# Patient Record
Sex: Male | Born: 1990 | Race: Black or African American | Hispanic: No | Marital: Single | Smoking: Never smoker
Health system: Southern US, Community
[De-identification: ages and names within clinical notes are randomized; demographics above are authoritative.]

## PROBLEM LIST (undated history)

## (undated) DIAGNOSIS — M419 Scoliosis, unspecified: Secondary | ICD-10-CM

## (undated) DIAGNOSIS — R480 Dyslexia and alexia: Secondary | ICD-10-CM

## (undated) DIAGNOSIS — R413 Other amnesia: Secondary | ICD-10-CM

## (undated) DIAGNOSIS — M549 Dorsalgia, unspecified: Secondary | ICD-10-CM

## (undated) DIAGNOSIS — G43909 Migraine, unspecified, not intractable, without status migrainosus: Secondary | ICD-10-CM

## (undated) DIAGNOSIS — Q8500 Neurofibromatosis, unspecified: Secondary | ICD-10-CM

## (undated) DIAGNOSIS — G8929 Other chronic pain: Secondary | ICD-10-CM

## (undated) HISTORY — DX: Scoliosis, unspecified: M41.9

## (undated) HISTORY — PX: HERNIA REPAIR: SHX51

## (undated) HISTORY — DX: Neurofibromatosis, unspecified: Q85.00

## (undated) HISTORY — DX: Other amnesia: R41.3

## (undated) HISTORY — DX: Dyslexia and alexia: R48.0

## (undated) HISTORY — DX: Other chronic pain: G89.29

## (undated) HISTORY — DX: Migraine, unspecified, not intractable, without status migrainosus: G43.909

## (undated) HISTORY — DX: Dorsalgia, unspecified: M54.9

---

## 2009-10-19 ENCOUNTER — Emergency Department (HOSPITAL_COMMUNITY): Admission: EM | Admit: 2009-10-19 | Discharge: 2009-10-19 | Payer: Self-pay | Admitting: Family Medicine

## 2010-09-21 LAB — STREP A DNA PROBE: Group A Strep Probe: NEGATIVE

## 2012-07-11 DIAGNOSIS — M419 Scoliosis, unspecified: Secondary | ICD-10-CM | POA: Insufficient documentation

## 2014-06-16 DIAGNOSIS — R0602 Shortness of breath: Secondary | ICD-10-CM | POA: Insufficient documentation

## 2014-06-17 ENCOUNTER — Other Ambulatory Visit (HOSPITAL_COMMUNITY): Payer: Self-pay | Admitting: Internal Medicine

## 2014-06-17 ENCOUNTER — Ambulatory Visit (HOSPITAL_COMMUNITY): Payer: Commercial Managed Care - PPO

## 2014-06-17 DIAGNOSIS — R0602 Shortness of breath: Secondary | ICD-10-CM

## 2014-06-17 DIAGNOSIS — R0781 Pleurodynia: Secondary | ICD-10-CM

## 2014-06-18 ENCOUNTER — Other Ambulatory Visit (HOSPITAL_COMMUNITY): Payer: Self-pay | Admitting: Internal Medicine

## 2014-06-18 ENCOUNTER — Ambulatory Visit (HOSPITAL_COMMUNITY)
Admission: RE | Admit: 2014-06-18 | Discharge: 2014-06-18 | Disposition: A | Discharge status: Home / Self Care | Payer: 59 | Source: Ambulatory Visit | Attending: Internal Medicine | Admitting: Internal Medicine

## 2014-06-18 DIAGNOSIS — J90 Pleural effusion, not elsewhere classified: Secondary | ICD-10-CM | POA: Diagnosis not present

## 2014-06-18 DIAGNOSIS — Q8500 Neurofibromatosis, unspecified: Secondary | ICD-10-CM | POA: Insufficient documentation

## 2014-06-18 DIAGNOSIS — R0781 Pleurodynia: Secondary | ICD-10-CM | POA: Insufficient documentation

## 2014-06-18 DIAGNOSIS — R0600 Dyspnea, unspecified: Secondary | ICD-10-CM | POA: Diagnosis not present

## 2014-06-18 DIAGNOSIS — R0602 Shortness of breath: Secondary | ICD-10-CM

## 2014-06-18 DIAGNOSIS — R079 Chest pain, unspecified: Secondary | ICD-10-CM | POA: Insufficient documentation

## 2014-07-14 ENCOUNTER — Ambulatory Visit (INDEPENDENT_AMBULATORY_CARE_PROVIDER_SITE_OTHER): Payer: 59 | Admitting: Pulmonary Disease

## 2014-07-14 ENCOUNTER — Ambulatory Visit (INDEPENDENT_AMBULATORY_CARE_PROVIDER_SITE_OTHER)
Admission: RE | Admit: 2014-07-14 | Discharge: 2014-07-14 | Disposition: A | Discharge status: Home / Self Care | Payer: 59 | Source: Ambulatory Visit | Attending: Pulmonary Disease | Admitting: Pulmonary Disease

## 2014-07-14 ENCOUNTER — Encounter: Payer: Self-pay | Admitting: Pulmonary Disease

## 2014-07-14 VITALS — BP 112/62 | HR 94 | Ht 68.0 in | Wt 188.0 lb

## 2014-07-14 DIAGNOSIS — R059 Cough, unspecified: Secondary | ICD-10-CM | POA: Insufficient documentation

## 2014-07-14 DIAGNOSIS — R05 Cough: Secondary | ICD-10-CM

## 2014-07-14 DIAGNOSIS — D3610 Benign neoplasm of peripheral nerves and autonomic nervous system, unspecified: Secondary | ICD-10-CM | POA: Insufficient documentation

## 2014-07-14 DIAGNOSIS — J90 Pleural effusion, not elsewhere classified: Secondary | ICD-10-CM

## 2014-07-14 MED ORDER — INDOMETHACIN 25 MG PO CAPS: 25.0000 mg | ORAL_CAPSULE | Freq: Two times a day (BID) | ORAL | Status: DC

## 2014-07-14 MED ORDER — DOXYCYCLINE HYCLATE 100 MG PO TABS: 100.0000 mg | ORAL_TABLET | Freq: Two times a day (BID) | ORAL | Status: DC

## 2014-07-14 MED ORDER — TRAMADOL HCL 50 MG PO TABS: 50.0000 mg | ORAL_TABLET | Freq: Four times a day (QID) | ORAL | Status: DC | PRN

## 2014-07-14 NOTE — Assessment & Plan Note (Addendum)
I have reviewed the images of the CT chest from December 2015 with Gary Dorsey today in clinic. He has chest pain of about 3 weeks duration which is consistent with pleurisy. Objectively his vital signs are normal and his lung exam is normal. On CT chest there is a very small pleural effusion with a trace amount of atelectasis in the right base.  I explained to him that a right-sided pleural effusion which develops acutely like this and a young adult who is a nonsmoker most likely represents an acute viral process such as viral pleurisy. I also explained that there is a differential diagnosis which includes connective tissue diseases versus less likely malignancy. Considering how small the effusion is on the CT chest to be very difficult to sample this fluid and the only way to get a diagnosis would be to perform a pleural biopsy which I do not think is warranted at this time considering the fact that this is most likely viral pleuris He had a slight amount of atelectasis versus less likely airspace disease associated with the effusion in the right base, while I do not feel strongly this represents pneumonia, considering the duration of symptoms it is worth treating him for bacterial pneumonia at this time.  Plan: -Doxycycline 100 mg by mouth twice a day 5 days with probiotic -Check chest x-ray today to ensure that fusion has not grown -Treat with Indocin around the clock for one week, advised not to use ibuprofen during this time -Return to clinic in 2-4 weeks if not better, sooner if worse -If pain still present in one month then we will need to figure out how to get a tissue or fluid sample for a diagnosis

## 2014-07-14 NOTE — Patient Instructions (Signed)
We will call you with the results of the chest x-ray  For the first three days: You need to try to suppress your cough to allow your larynx (voice box) to heal.  For three days don't talk, laugh, sing, or clear your throat. Do everything you can to suppress the cough during this time. Use hard candies (sugarless Jolly Ranchers) or non-mint or non-menthol containing cough drops during this time to soothe your throat.  Use a cough suppressant (tramadol) around the clock during this time.  After three days, gradually increase the use of your voice and back off on the cough suppressants and change to delsym.  After three days as noted above, use the indomethacin 25mg  twice a day for 7 days  If you are not better in 3-4 weeks come back to see me, if worse see me sooner, if better then cancel the appointment

## 2014-07-14 NOTE — Assessment & Plan Note (Signed)
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 There is no nodular contour to the pleura. There is only a slight amount of pleural thickening with this very tiny pleural effusion. If the effusion and pain have not improved, as outlined above, we will repeat a CT chest.

## 2014-07-14 NOTE — Assessment & Plan Note (Signed)
He has a slight hacking cough related to this illness. As noted above we will treat for community acquired pneumonia. Also, I'll repeat a chest x-ray. I have also advised that he use tramadol for 3 times with voice rest and make an attempt to actively suppress the cough.

## 2014-07-14 NOTE — Progress Notes (Signed)
Subjective:    Patient ID: Gary Dorsey, male    DOB: 08/31/1990, 24 y.o.   MRN: [id number]  HPI Chief Complaint  Patient presents with  . Advice Only    referred for pleural effusion by Dr Ardeth Perfect.  Pt having R sided sharp pain Xfew weeks.     This is a very pleasant 24 year old male who is a nonsmoker who comes to my clinic today for evaluation of a right-sided pleural effusion. He says that 3-4 weeks ago he developed the insidious onset of right sided chest pain. He says that the pain has been sharp in nature and somewhat constant. It is worsened by taking a deep breath and lying on that side. He says that it has been associated with a slight cough. He denies a fever or chills. He denies shortness of breath. He has not been producing sputum. He denies having sick contacts. He has a past medical history significant for neurofibromatosis and he believes that he has developed 2 new nodules in the skin over his liver in the last 3-4 months.  He has taken over-the-counter cough medication which he says has not helped much. He says he has also been using ibuprofen which helps with the pain. He denies recent fever. Overall, he says that the pain has improved somewhat in the last few weeks.  Past Medical History  Diagnosis Date  . Hx of neurofibromatosis      Family History  Problem Relation Age of Onset  . Family history unknown: Yes     History   Social History  . Marital Status: Single    Spouse Name: N/A    Number of Children: N/A  . Years of Education: N/A   Occupational History  . Not on file.   Social History Main Topics  . Smoking status: Never Smoker   . Smokeless tobacco: Never Used  . Alcohol Use: 0.0 oz/week    0 Not specified per week     Comment: very rarely  . Drug Use: No  . Sexual Activity: Not on file   Other Topics Concern  . Not on file   Social History Narrative  . No narrative on file     Allergies  Allergen Reactions  . Contrast Media  [Iodinated Diagnostic Agents]     Nausea,vomiting     No outpatient prescriptions prior to visit.   No facility-administered medications prior to visit.       Review of Systems  Constitutional: Negative for fever and unexpected weight change.  HENT: Negative for congestion, dental problem, ear pain, nosebleeds, postnasal drip, rhinorrhea, sinus pressure, sneezing, sore throat and trouble swallowing.   Eyes: Negative for redness and itching.  Respiratory: Positive for cough and chest tightness. Negative for shortness of breath and wheezing.   Cardiovascular: Negative for palpitations and leg swelling.  Gastrointestinal: Negative for nausea and vomiting.  Genitourinary: Negative for dysuria.  Musculoskeletal: Negative for joint swelling.  Skin: Negative for rash.  Neurological: Negative for headaches.  Hematological: Does not bruise/bleed easily.  Psychiatric/Behavioral: Negative for dysphoric mood. The patient is not nervous/anxious.        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 He has chest pain of about 3 weeks duration which is consistent with pleurisy. Objectively his vital signs are normal and his lung exam is normal. On CT chest there is a very small pleural effusion with a trace amount of  atelectasis in the right base.  I explained to him that a right-sided pleural effusion which develops acutely like this and a young adult who is a nonsmoker most likely represents an acute viral process such as viral pleurisy. I also explained that there is a differential diagnosis which includes connective tissue diseases versus less likely malignancy. Considering how small the effusion is on the CT chest to be very difficult to sample this fluid and the only way to get a diagnosis would be to perform a pleural biopsy which I do not think is warranted at this time considering the fact that this is most likely viral pleuris He had a slight amount of atelectasis versus less likely airspace disease associated with the effusion in the right base, while I do not feel strongly this represents pneumonia, considering the duration of symptoms it is worth treating him for bacterial pneumonia at this time.  Plan: -Doxycycline 100 mg by mouth twice a day 5 days with probiotic -Check chest x-ray today to ensure that fusion has not grown -Treat with Indocin around the clock for one week, advised not to use ibuprofen during this time -Return to clinic in 2-4 weeks if not better, sooner if worse -If pain still present in one month then we will need to figure out how to get a tissue or fluid sample for a diagnosis  Cough He has a slight hacking cough related to this illness. As noted above we will treat for community acquired pneumonia. Also, I'll repeat a chest x-ray. I have also advised that he use tramadol for 3 times with voice rest and make an attempt to actively suppress the cough.  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 There is no nodular contour to the pleura. There is only a slight amount of pleural thickening with this very tiny pleural effusion. If the effusion and pain have not improved, as  outlined above, we will repeat a CT chest.    Updated Medication List Outpatient Encounter Prescriptions as of 07/14/2014  Medication Sig  . dextromethorphan (DELSYM) 30 MG/5ML liquid Take 15 mg by mouth daily as needed for cough.  . [DISCONTINUED] ibuprofen (ADVIL,MOTRIN) 400 MG tablet Take 400 mg by mouth every 6 (six) hours as needed.  . doxycycline (VIBRA-TABS) 100 MG tablet Take 1 tablet (100 mg total) by mouth 2 (two) times daily.  . indomethacin (INDOCIN) 25 MG capsule Take 1 capsule (25 mg total) by mouth 2 (two) times daily with a meal.  . traMADol (ULTRAM) 50 MG tablet Take 1 tablet (50 mg total) by mouth every 6 (six) hours as needed (cough).

## 2014-07-16 NOTE — Progress Notes (Signed)
Quick Note:  lmtcb X1 ______ 

## 2014-07-17 ENCOUNTER — Telehealth: Payer: Self-pay | Admitting: Pulmonary Disease

## 2014-07-17 NOTE — Telephone Encounter (Signed)
A,      Please let him know that the fluid has not increased in size.    Thanks    B   --  I spoke with patient about results and he verbalized understanding and had no questions

## 2014-08-15 ENCOUNTER — Encounter: Payer: Self-pay | Admitting: Pulmonary Disease

## 2014-08-15 ENCOUNTER — Ambulatory Visit (HOSPITAL_COMMUNITY)
Admission: RE | Admit: 2014-08-15 | Discharge: 2014-08-15 | Disposition: A | Discharge status: Home / Self Care | Payer: 59 | Source: Ambulatory Visit | Attending: Pulmonary Disease | Admitting: Pulmonary Disease

## 2014-08-15 ENCOUNTER — Other Ambulatory Visit (INDEPENDENT_AMBULATORY_CARE_PROVIDER_SITE_OTHER): Payer: 59

## 2014-08-15 ENCOUNTER — Ambulatory Visit (INDEPENDENT_AMBULATORY_CARE_PROVIDER_SITE_OTHER): Payer: 59 | Admitting: Pulmonary Disease

## 2014-08-15 VITALS — BP 116/64 | HR 82 | Ht 68.0 in | Wt 185.0 lb

## 2014-08-15 DIAGNOSIS — R0789 Other chest pain: Secondary | ICD-10-CM

## 2014-08-15 DIAGNOSIS — R091 Pleurisy: Secondary | ICD-10-CM | POA: Insufficient documentation

## 2014-08-15 DIAGNOSIS — J90 Pleural effusion, not elsewhere classified: Secondary | ICD-10-CM

## 2014-08-15 DIAGNOSIS — R05 Cough: Secondary | ICD-10-CM

## 2014-08-15 DIAGNOSIS — R079 Chest pain, unspecified: Secondary | ICD-10-CM | POA: Insufficient documentation

## 2014-08-15 DIAGNOSIS — R059 Cough, unspecified: Secondary | ICD-10-CM

## 2014-08-15 LAB — BASIC METABOLIC PANEL
BUN: 19 mg/dL (ref 6–23)
CALCIUM: 9.4 mg/dL (ref 8.4–10.5)
CHLORIDE: 104 meq/L (ref 96–112)
CO2: 28 mEq/L (ref 19–32)
Creatinine, Ser: 1.18 mg/dL (ref 0.40–1.50)
GFR: 96.13 mL/min (ref >60.00)
Glucose, Bld: 89 mg/dL (ref 70–99)
POTASSIUM: 4.1 meq/L (ref 3.5–5.1)
SODIUM: 137 meq/L (ref 135–145)

## 2014-08-15 MED ORDER — BENZONATATE 200 MG PO CAPS: 200.0000 mg | ORAL_CAPSULE | Freq: Three times a day (TID) | ORAL | Status: DC | PRN

## 2014-08-15 MED ORDER — HYDROCOD POLST-CHLORPHEN POLST 10-8 MG/5ML PO LQCR: 5.0000 mL | Freq: Two times a day (BID) | ORAL | Status: DC | PRN

## 2014-08-15 MED ORDER — IOHEXOL 350 MG/ML SOLN
100.0000 mL | Freq: Once | INTRAVENOUS | Status: AC | PRN
  Administered 2014-08-15: 100 mL via INTRAVENOUS

## 2014-08-15 MED ORDER — PREDNISONE 10 MG PO TABS: ORAL_TABLET | ORAL | Status: DC

## 2014-08-15 NOTE — Progress Notes (Signed)
Subjective:    Patient ID: Gary Dorsey, male    DOB: October 05, 1990, 24 y.o.   MRN: [id number]  Synopsis: Gary Dorsey was referred in 2016 for evaluation of chest pain with nonspecific pleural thickening seen on a CT chest from December 2015.  HPI Chief Complaint  Patient presents with  . Follow-up    Pt's pain level is unchanged, pain is more in the back instead of the side, sometimes prod cough with yellow mucus.     Gary Dorsey returns to clinic today for further evaluation of his cough and chest pain. He says that the pain has migrated somewhat and is now more vague back pain rather than frank right-sided chest pain. However, he continues to cough frequently. He has no shortness of breath. Cough is quite bothersome as is the chest pain. The pain is still worse with coughing and with taking a deep breath. It is sharp in nature. He is not coughing up mucus at this point that he continues to have a dry cough. He took the doxycycline he said this helped him produce a lot of mucus. Also, the indomethacin did help the chest pain briefly while he was taking it.   Past Medical History  Diagnosis Date  . Hx of neurofibromatosis       Review of Systems  Constitutional: Negative for fever, chills and fatigue.  HENT: Negative for postnasal drip, rhinorrhea and sinus pressure.   Respiratory: Positive for cough and shortness of breath. Negative for wheezing.   Cardiovascular: Positive for chest pain. Negative for palpitations and leg swelling.       Objective:   Physical Exam Filed Vitals:   08/15/14 1626  BP: 116/64  Pulse: 82  Height: 5\' 8"  (1.727 m)  Weight: 185 lb (83.915 kg)  SpO2: 97%  RA   Gen: well appearing, no acute distress HEENT: NCAT, EOMi, OP clear,  PULM: CTA B CV: RRR, no mgr, no JVD AB: BS+, soft, nontender Ext: warm, no edema, no clubbing, no cyanosis Derm: neurofibroma right lower back Neuro: A&Ox4, MAEW   January 2016 chest x-ray and December 2015 chest CT images  reviewed again today in clinic> there is a persistent right-sided pleural effusion versus pleural thickening noted on both      Assessment & Plan:   Pleural effusion I am concerned about the ongoing chest pain and pleuritic pain. Specifically, what bothers me the most is the fact that the pleural thickening and pain seem to have persisted. I explained to him today that again the most likely scenario is that this is a viral pleurisy, but the differential diagnosis includes pulmonary embolism, less likely an inflammatory process from something like lupus, or even less likely a malignancy.  To move forward think we need to evaluate for pulmonary embolism. Assuming that is negative then I want him to treat this with steroids to see if he can make the pain go away. Before he starts steroids we will work him up for connective tissue disease.  Plan: -CT angiogram chest -Check serum ANA -Prednisone 3 weeks total starting at 30 mg, tapering down -If persistent pain and pleural thickening after 3 weeks of prednisone then consider a pleural biopsy   Cough This continues to be a problem for him.   Plan:  Tussionex to use at night Tessalon to use during the daytime     Updated Medication List Outpatient Encounter Prescriptions as of 08/15/2014  Medication Sig  . dextromethorphan (DELSYM) 30 MG/5ML liquid Take 15 mg  by mouth daily as needed for cough.  . traMADol (ULTRAM) 50 MG tablet Take 1 tablet (50 mg total) by mouth every 6 (six) hours as needed (cough).  . benzonatate (TESSALON) 200 MG capsule Take 1 capsule (200 mg total) by mouth 3 (three) times daily as needed for cough.  . predniSONE (DELTASONE) 10 MG tablet Take 30mg  daily for a week, then 20mg  daily for a week, then 10mg  daily for a week  . [DISCONTINUED] chlorpheniramine-HYDROcodone (TUSSIONEX PENNKINETIC ER) 10-8 MG/5ML LQCR Take 5 mLs by mouth every 12 (twelve) hours as needed for cough.  . [DISCONTINUED] doxycycline (VIBRA-TABS)  100 MG tablet Take 1 tablet (100 mg total) by mouth 2 (two) times daily.  . [DISCONTINUED] indomethacin (INDOCIN) 25 MG capsule Take 1 capsule (25 mg total) by mouth 2 (two) times daily with a meal.

## 2014-08-15 NOTE — Assessment & Plan Note (Signed)
This continues to be a problem for him.   Plan:  Tussionex to use at night Tessalon to use during the daytime

## 2014-08-15 NOTE — Assessment & Plan Note (Signed)
I am concerned about the ongoing chest pain and pleuritic pain. Specifically, what bothers me the most is the fact that the pleural thickening and pain seem to have persisted. I explained to him today that again the most likely scenario is that this is a viral pleurisy, but the differential diagnosis includes pulmonary embolism, less likely an inflammatory process from something like lupus, or even less likely a malignancy.  To move forward think we need to evaluate for pulmonary embolism. Assuming that is negative then I want him to treat this with steroids to see if he can make the pain go away. Before he starts steroids we will work him up for connective tissue disease.  Plan: -CT angiogram chest -Check serum ANA -Prednisone 3 weeks total starting at 30 mg, tapering down -If persistent pain and pleural thickening after 3 weeks of prednisone then consider a pleural biopsy

## 2014-08-15 NOTE — Patient Instructions (Addendum)
Take the Tussionex twice a day as needed for cough, don't take it and drive Use tessalon perles on work days for the cough instead of the Tussionex (tessalon is non-sedating) Take prednisone for three weeks as I prescribed We will call you with the results of the CT scan We will see you back in 3-4 weeks or sooner if needed

## 2014-08-19 NOTE — Progress Notes (Signed)
Quick Note:  lmtcb X1 to make pt aware. ______

## 2014-08-20 NOTE — Progress Notes (Signed)
Quick Note:  Pt aware of results and recs ______ 

## 2014-09-08 ENCOUNTER — Encounter: Payer: Self-pay | Admitting: Adult Health

## 2014-09-08 ENCOUNTER — Other Ambulatory Visit (INDEPENDENT_AMBULATORY_CARE_PROVIDER_SITE_OTHER): Payer: 59

## 2014-09-08 ENCOUNTER — Ambulatory Visit (INDEPENDENT_AMBULATORY_CARE_PROVIDER_SITE_OTHER): Payer: 59 | Admitting: Adult Health

## 2014-09-08 VITALS — BP 100/70 | HR 74 | Temp 98.6°F | Ht 68.0 in | Wt 188.0 lb

## 2014-09-08 DIAGNOSIS — R091 Pleurisy: Secondary | ICD-10-CM

## 2014-09-08 DIAGNOSIS — R05 Cough: Secondary | ICD-10-CM

## 2014-09-08 DIAGNOSIS — R0789 Other chest pain: Secondary | ICD-10-CM

## 2014-09-08 DIAGNOSIS — R059 Cough, unspecified: Secondary | ICD-10-CM

## 2014-09-08 NOTE — Assessment & Plan Note (Signed)
Cough ? Etiology  Check PFT on return  Cough control  Follow up labs

## 2014-09-08 NOTE — Assessment & Plan Note (Signed)
Improving right-sided pleuritic pain, questionable etiology For now taper off steroids  Follow up labs- ANA and ESR done today, (missed in lab last ov )  follow up 4 weeks

## 2014-09-08 NOTE — Patient Instructions (Signed)
Labs today  Finish Prednisone today as planned  Delsym  2 tsp Twice daily  As needed  Cough .  Follow up Dr. Lake Bells in 4 weeks with PFT and As needed

## 2014-09-08 NOTE — Progress Notes (Signed)
   Subjective:    Patient ID: Gary Dorsey, male    DOB: December 24, 1990, 24 y.o.   MRN: [id number]  HPI  24 year old male with neurofibromatosis  Synopsis: Gary Dorsey was referred in 2016 for evaluation of chest pain with nonspecific pleural thickening seen on a CT chest from December 2015.  09/08/2014 Follow up  Returns for a three-week follow-up. Patient has been having right-sided pleuritic chest wall pain over the last 3 months. He was seen for initial pulmonary consultation in January. CT chest from December 2015 showed a nonspecific pleural thickening. Patient was sent for a CT chest angiogram that showed no pulmonary embolism, mild right basilar atelectasis and tiny right pleural effusion versus pleural thickening. Patient was started on a prednisone taper over the last 3 weeks. Last dose today. Patient says his pain is quite a bit better , however, he continues to have an intermittent dry cough and some tenderness with trying to lie on the right side. He denies any hemoptysis, orthopnea, PND, leg swelling or edema. No joint swelling or rash.    Review of Systems Constitutional:   No  weight loss, night sweats,  Fevers, chills, fatigue, or  lassitude.  HEENT:   No headaches,  Difficulty swallowing,  Tooth/dental problems, or  Sore throat,                No sneezing, itching, ear ache, nasal congestion, post nasal drip,   CV:  No chest pain,  Orthopnea, PND, swelling in lower extremities, anasarca, dizziness, palpitations, syncope.   GI  No heartburn, indigestion, abdominal pain, nausea, vomiting, diarrhea, change in bowel habits, loss of appetite, bloody stools.   Resp: No chest wall deformity  Skin: no rash or lesions.  GU: no dysuria, change in color of urine, no urgency or frequency.  No flank pain, no hematuria   MS:  No joint pain or swelling.  No decreased range of motion.  No back pain.  Psych:  No change in mood or affect. No depression or anxiety.  No memory  loss.         Objective:   Physical Exam  GEN: A/Ox3; pleasant , NAD, well nourished   HEENT:  [place]/AT,  EACs-clear, TMs-wnl, NOSE-clear, THROAT-clear, no lesions, no postnasal drip or exudate noted.   NECK:  Supple w/ fair ROM; no JVD; normal carotid impulses w/o bruits; no thyromegaly or nodules palpated; no lymphadenopathy.  RESP  Clear  P & A; w/o, wheezes/ rales/ or rhonchi.no accessory muscle use, no dullness to percussion  CARD:  RRR, no m/r/g  , no peripheral edema, pulses intact, no cyanosis or clubbing.  GI:   Soft & nt; nml bowel sounds; no organomegaly or masses detected.  Musco: Warm bil, no deformities or joint swelling noted.   Neuro: alert, no focal deficits noted.    Skin: Warm, no lesions or rashes        Assessment & Plan:

## 2014-09-09 LAB — ANA: Anti Nuclear Antibody(ANA): NEGATIVE

## 2014-09-09 LAB — SEDIMENTATION RATE: SED RATE: 13 mm/h (ref 0–22)

## 2014-09-10 NOTE — Progress Notes (Signed)
Quick Note:  ATC pt x1. Line rang multiple times with no answer and no option to LM. WCB. ______

## 2014-09-11 ENCOUNTER — Telehealth: Payer: Self-pay | Admitting: Pulmonary Disease

## 2014-09-11 NOTE — Telephone Encounter (Signed)
Notes Recorded by Melvenia Needles, NP on 09/09/2014 at 5:11 PM ANA is neg  Sed rate is nml  Cont w/ ov recs  Please contact office for sooner follow up if symptoms do not improve or worsen or seek emergency care  -- Pt is aware of results.

## 2014-09-11 NOTE — Progress Notes (Signed)
I agree with above plan

## 2014-09-11 NOTE — Telephone Encounter (Signed)
Error

## 2014-10-20 ENCOUNTER — Other Ambulatory Visit: Payer: Self-pay | Admitting: Pulmonary Disease

## 2014-10-20 DIAGNOSIS — R0600 Dyspnea, unspecified: Secondary | ICD-10-CM

## 2014-10-21 ENCOUNTER — Ambulatory Visit (INDEPENDENT_AMBULATORY_CARE_PROVIDER_SITE_OTHER): Payer: 59 | Admitting: Pulmonary Disease

## 2014-10-21 ENCOUNTER — Encounter: Payer: Self-pay | Admitting: Pulmonary Disease

## 2014-10-21 ENCOUNTER — Ambulatory Visit (INDEPENDENT_AMBULATORY_CARE_PROVIDER_SITE_OTHER)
Admission: RE | Admit: 2014-10-21 | Discharge: 2014-10-21 | Disposition: A | Discharge status: Home / Self Care | Payer: 59 | Source: Ambulatory Visit | Attending: Pulmonary Disease | Admitting: Pulmonary Disease

## 2014-10-21 VITALS — BP 124/70 | HR 66 | Ht 68.0 in | Wt 188.0 lb

## 2014-10-21 DIAGNOSIS — R0600 Dyspnea, unspecified: Secondary | ICD-10-CM

## 2014-10-21 DIAGNOSIS — R059 Cough, unspecified: Secondary | ICD-10-CM

## 2014-10-21 DIAGNOSIS — R05 Cough: Secondary | ICD-10-CM | POA: Diagnosis not present

## 2014-10-21 DIAGNOSIS — J90 Pleural effusion, not elsewhere classified: Secondary | ICD-10-CM

## 2014-10-21 LAB — PULMONARY FUNCTION TEST
DL/VA % PRED: 119 %
DL/VA: 5.44 ml/min/mmHg/L
DLCO UNC % PRED: 85 %
DLCO UNC: 25.25 ml/min/mmHg
FEF 25-75 POST: 3.86 L/s
FEF 25-75 Pre: 3.61 L/sec
FEF2575-%Change-Post: 7 %
FEF2575-%PRED-PRE: 86 %
FEF2575-%Pred-Post: 92 %
FEV1-%Change-Post: 1 %
FEV1-%PRED-PRE: 82 %
FEV1-%Pred-Post: 83 %
FEV1-POST: 3.07 L
FEV1-Pre: 3.03 L
FEV1FVC-%CHANGE-POST: 0 %
FEV1FVC-%PRED-PRE: 103 %
FEV6-%CHANGE-POST: 1 %
FEV6-%PRED-PRE: 80 %
FEV6-%Pred-Post: 81 %
FEV6-POST: 3.52 L
FEV6-Pre: 3.48 L
FEV6FVC-%CHANGE-POST: 0 %
FEV6FVC-%PRED-POST: 100 %
FEV6FVC-%Pred-Pre: 100 %
FVC-%CHANGE-POST: 1 %
FVC-%PRED-POST: 81 %
FVC-%Pred-Pre: 80 %
FVC-POST: 3.55 L
FVC-Pre: 3.48 L
PRE FEV1/FVC RATIO: 87 %
Post FEV1/FVC ratio: 87 %
Post FEV6/FVC ratio: 99 %
Pre FEV6/FVC Ratio: 100 %
RV % pred: 83 %
RV: 1.19 L
TLC % pred: 76 %
TLC: 4.93 L

## 2014-10-21 MED ORDER — BENZONATATE 200 MG PO CAPS: 200.0000 mg | ORAL_CAPSULE | Freq: Three times a day (TID) | ORAL | Status: DC | PRN

## 2014-10-21 NOTE — Assessment & Plan Note (Signed)
He had an idiopathic, very small right-sided pleural effusion which is too small to sample this year. It is presumed that this is related to either a viral infection or somehow postinfectious. A CT chest performed in February 2016 did not show evidence of an underlying malignancy or other abnormality. His serum connective tissue disease panel did not show an elevated ANA. My presumption is that this was related to an infection from this year. Fortunately, symptoms have resolved but he continues to have some dullness to percussion as well as diminished breath sounds in the right base. We will obtain a chest x-ray today to ensure that the effusion has not increased in size. If the effusion has increased and we can sample it with a thoracentesis.  Plan: -Chest x-ray today -If the right-sided pleural effusion has increased in size than consider thoracentesis -Otherwise, no further workup needed

## 2014-10-21 NOTE — Progress Notes (Signed)
Subjective:    Patient ID: Gary Dorsey, male    DOB: 07-Sep-1990, 24 y.o.   MRN: [id number]  Synopsis: Gary Dorsey was referred in 2016 for evaluation of chest pain with nonspecific pleural thickening seen on a CT chest from December 2015.  HPI Chief Complaint  Patient presents with  . Follow-up    review pft.  pt states chest pain is gone, c/o sob with exertion, sometimes prod cough with yellow mucus.     Adriance of the chest pain went away with the prednisone has not returned. He has no problems with shortness of breath unless he is performing heavy exertion. He has some cough which is occasionally productive of yellow mucus. This is worse when he has been working heavily and he goes home to rest. He says it is typically worse when he lies flat. Primarily in the evenings it is dry. He does have some chest congestion from time to time. He denies sinus congestion. He denies heartburn, acid reflux, or indigestion.   Past Medical History  Diagnosis Date  . Hx of neurofibromatosis       Review of Systems  Constitutional: Negative for fever, chills and fatigue.  HENT: Negative for postnasal drip, rhinorrhea and sinus pressure.   Respiratory: Positive for cough. Negative for shortness of breath and wheezing.   Cardiovascular: Negative for chest pain, palpitations and leg swelling.       Objective:   Physical Exam Filed Vitals:   10/21/14 1559  BP: 124/70  Pulse: 66  Height: 5\' 8"  (1.727 m)  Weight: 188 lb (85.276 kg)  SpO2: 98%  RA   Gen: well appearing, no acute distress HEENT: NCAT, EOMi, OP clear,  PULM: Slightly diminished right base, decreased percussion right base, otherwise clear to auscultation CV: RRR, no mgr, no JVD AB: BS+, soft, nontender Ext: warm, no edema, no clubbing, no cyanosis Derm: neurofibroma right lower back Neuro: A&Ox4, MAEW   January 2016 chest x-ray and December 2015 chest CT images reviewed again today in clinic (April 2016 visit)> there is a  persistent right-sided pleural effusion versus pleural thickening noted on both  10/21/2014 pulmonary function testing, ratio 87%, FEV1 3.07 L (83% predicted), total lung capacity 4.93 L (76% predicted), DLCO 25.25 (85% predicted)    Assessment & Plan:   Pleural effusion He had an idiopathic, very small right-sided pleural effusion which is too small to sample this year. It is presumed that this is related to either a viral infection or somehow postinfectious. A CT chest performed in February 2016 did not show evidence of an underlying malignancy or other abnormality. His serum connective tissue disease panel did not show an elevated ANA. My presumption is that this was related to an infection from this year. Fortunately, symptoms have resolved but he continues to have some dullness to percussion as well as diminished breath sounds in the right base. We will obtain a chest x-ray today to ensure that the effusion has not increased in size. If the effusion has increased and we can sample it with a thoracentesis.  Plan: -Chest x-ray today -If the right-sided pleural effusion has increased in size than consider thoracentesis -Otherwise, no further workup needed   Cough This continues today. His lung function testing from today was reviewed and there is no evidence of underlying lung disease. I explained to him that I feel that is most likely related to ongoing laryngeal irritation as well as acid reflux considering the fact that it worsens when he  lies flat.  Plan: -Gastroesophageal reflux disease lifestyle modification reviewed today in clinic at length -Use Pepcid at night -Elevated the head of the bed -Voice rest was encouraged -Tessalon as needed for cough -Return to clinic if no improvement     Updated Medication List Outpatient Encounter Prescriptions as of 10/21/2014  Medication Sig  . benzonatate (TESSALON) 200 MG capsule Take 1 capsule (200 mg total) by mouth 3 (three) times daily  as needed for cough.  . dextromethorphan (DELSYM) 30 MG/5ML liquid Take 15 mg by mouth daily as needed for cough.  . [DISCONTINUED] benzonatate (TESSALON) 200 MG capsule Take 1 capsule (200 mg total) by mouth 3 (three) times daily as needed for cough.  . [DISCONTINUED] traMADol (ULTRAM) 50 MG tablet Take 1 tablet (50 mg total) by mouth every 6 (six) hours as needed (cough).

## 2014-10-21 NOTE — Progress Notes (Signed)
PFT done today. 

## 2014-10-21 NOTE — Patient Instructions (Signed)
Follow the acid reflux diet we gave you , if the cough hasn't improved then take Zantac at night You need to try to suppress your cough to allow your larynx (voice box) to heal.  For three days don't talk, laugh, sing, or clear your throat. Do everything you can to suppress the cough during this time. Use hard candies (sugarless Jolly Ranchers) or non-mint or non-menthol containing cough drops during this time to soothe your throat.  Use a cough suppressant (Delsym or what I have prescribed you) around the clock during this time.  After three days, gradually increase the use of your voice and back off on the cough suppressants. We will call you with the results of the chest x-ray We will see you back if the cough doesn't improve

## 2014-10-21 NOTE — Assessment & Plan Note (Signed)
This continues today. His lung function testing from today was reviewed and there is no evidence of underlying lung disease. I explained to him that I feel that is most likely related to ongoing laryngeal irritation as well as acid reflux considering the fact that it worsens when he lies flat.  Plan: -Gastroesophageal reflux disease lifestyle modification reviewed today in clinic at length -Use Pepcid at night -Elevated the head of the bed -Voice rest was encouraged -Tessalon as needed for cough -Return to clinic if no improvement

## 2015-12-30 DIAGNOSIS — Q8501 Neurofibromatosis, type 1: Secondary | ICD-10-CM | POA: Diagnosis not present

## 2015-12-30 DIAGNOSIS — Z6830 Body mass index (BMI) 30.0-30.9, adult: Secondary | ICD-10-CM | POA: Diagnosis not present

## 2015-12-30 DIAGNOSIS — R413 Other amnesia: Secondary | ICD-10-CM | POA: Diagnosis not present

## 2015-12-30 DIAGNOSIS — G43909 Migraine, unspecified, not intractable, without status migrainosus: Secondary | ICD-10-CM | POA: Diagnosis not present

## 2015-12-31 ENCOUNTER — Ambulatory Visit (HOSPITAL_COMMUNITY)
Admission: RE | Admit: 2015-12-31 | Discharge: 2015-12-31 | Disposition: A | Discharge status: Home / Self Care | Payer: 59 | Source: Ambulatory Visit | Attending: Internal Medicine | Admitting: Internal Medicine

## 2015-12-31 ENCOUNTER — Other Ambulatory Visit (HOSPITAL_COMMUNITY): Payer: Self-pay | Admitting: Internal Medicine

## 2015-12-31 DIAGNOSIS — R930 Abnormal findings on diagnostic imaging of skull and head, not elsewhere classified: Secondary | ICD-10-CM | POA: Insufficient documentation

## 2015-12-31 DIAGNOSIS — Q8501 Neurofibromatosis, type 1: Secondary | ICD-10-CM | POA: Diagnosis not present

## 2015-12-31 DIAGNOSIS — R220 Localized swelling, mass and lump, head: Secondary | ICD-10-CM | POA: Diagnosis not present

## 2015-12-31 DIAGNOSIS — G43909 Migraine, unspecified, not intractable, without status migrainosus: Secondary | ICD-10-CM | POA: Diagnosis not present

## 2016-01-14 ENCOUNTER — Telehealth: Payer: Self-pay | Admitting: Neurology

## 2016-01-14 ENCOUNTER — Ambulatory Visit (INDEPENDENT_AMBULATORY_CARE_PROVIDER_SITE_OTHER): Payer: 59 | Admitting: Neurology

## 2016-01-14 ENCOUNTER — Encounter: Payer: Self-pay | Admitting: Neurology

## 2016-01-14 VITALS — BP 107/65 | HR 65 | Ht 68.0 in | Wt 196.0 lb

## 2016-01-14 DIAGNOSIS — D334 Benign neoplasm of spinal cord: Secondary | ICD-10-CM | POA: Diagnosis not present

## 2016-01-14 DIAGNOSIS — Q8500 Neurofibromatosis, unspecified: Secondary | ICD-10-CM

## 2016-01-14 DIAGNOSIS — H539 Unspecified visual disturbance: Secondary | ICD-10-CM | POA: Diagnosis not present

## 2016-01-14 DIAGNOSIS — C7259 Malignant neoplasm of other cranial nerves: Secondary | ICD-10-CM

## 2016-01-14 DIAGNOSIS — Q8501 Neurofibromatosis, type 1: Secondary | ICD-10-CM

## 2016-01-14 DIAGNOSIS — C719 Malignant neoplasm of brain, unspecified: Secondary | ICD-10-CM

## 2016-01-14 DIAGNOSIS — R51 Headache: Secondary | ICD-10-CM

## 2016-01-14 DIAGNOSIS — M542 Cervicalgia: Secondary | ICD-10-CM

## 2016-01-14 DIAGNOSIS — M5441 Lumbago with sciatica, right side: Secondary | ICD-10-CM

## 2016-01-14 DIAGNOSIS — R29898 Other symptoms and signs involving the musculoskeletal system: Secondary | ICD-10-CM | POA: Diagnosis not present

## 2016-01-14 DIAGNOSIS — G43009 Migraine without aura, not intractable, without status migrainosus: Secondary | ICD-10-CM

## 2016-01-14 DIAGNOSIS — C7230 Malignant neoplasm of unspecified optic nerve: Secondary | ICD-10-CM

## 2016-01-14 DIAGNOSIS — R519 Headache, unspecified: Secondary | ICD-10-CM

## 2016-01-14 MED ORDER — RIZATRIPTAN BENZOATE 10 MG PO TBDP: 10.0000 mg | ORAL_TABLET | ORAL | Status: DC | PRN

## 2016-01-14 MED ORDER — ONDANSETRON 8 MG PO TBDP: 8.0000 mg | ORAL_TABLET | ORAL | Status: DC | PRN

## 2016-01-14 NOTE — Progress Notes (Signed)
[facility]    Provider:  Dr Jaynee Eagles Referring Provider: Velna Hatchet, MD Primary Care Physician:  Velna Hatchet, MD  CC:  Migraines and NF1.   HPI:  Gary Dorsey is a 25 y.o. male here as a referral from Dr. Ardeth Perfect for migraines and NF1. He has not seen a neurologist in years. His mother is here and provides most information. He has a large fibroma on his head, the back of the neck, on the back. 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 He has not seen an opthalmologist recently and he has an appointment with neurosurgery soon. He has neck and back pain. No recent imaging of the spine. He has not noticed his vision changing significantly. He has learning disabilities, dyslexia.He is having neck and back pain, worsening. No sensory changes, he has weakness of the right leg, no falls. He has had migraines since he was 1. Imitrex never worked. He tried relpax. They start unilaterally. Then spreads. It is pounding, pressure behind the eyes. Nausea, vomits, light sensitivity, sound sensitivity, stress exacerbates it. Unknwon triggers. Sounds makes it worse, needs to sit still. The migraines are rare. No seizures. +learning disabilities. .   Reviewed notes, labs and imaging from outside physicians, which showed:  Personally reviewed MRI of the brain 12/31/2015 and agree with the following: 1. 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 Would gladly correlate with outside imaging, if available. 2. Two small scalp masses, presumed neurofibromas.  BMP normal with creatinine 1.1, CBC unremarkable, LFTs normal, TSH within normal limits.  Reviewed notes from [facility]. Chief complaint migraine, patient was seen 12/31/2015 with a migraine since Sunday, took Excedrin it got better and woke up with a migraine the next day. He is a 25 year old male with a history of  neurofibromatosis, who presented with a four-day history of migraine headaches. Patient has had intermittent migraines in the past, usually occurring every 3-4 months lasting approximately 2-3 days in duration, the episode being the longest he can remember. Reports slight dizziness, nausea and light sensitivity. He was last seen 12/31/2015. He also experiences dizziness with rapid changes in positions during the migraine. Pain can be 6-7 out of 10 during the migraines. No aggravating factors. Prolonged deceased habit last his 4 days. Now followed by neurology since [place] 5 years ago despite multiple referrals. Neurologic exam was normal including general, eyes, ears, nose, throat, cardiovascular, respiratory, musculoskeletal, skin, neurologic, mental status exam. He was diagnosed with neurofibromatosis with large plexiform tumors, dyslexia, short-term memory loss and ADHD as a child. He has scoliosis, chronic back pain and intermittent migraines. Given his onset of 4 days of migraines, an MRI was completed as above. He was treated with Zofran for his nausea and referred here for his migraines.   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 He was diagnosed at age 18 years based on meeting multiple clinical criteria. He has had MRI Brain scans, Spine for evaluation of central and nerve root neurofibromas. Report of MRI Brain from 2000 performed at [facility] report multiple T2 signal changes without distinct gliomas. Patient and mother report subsequent MRI Brain which were normal as well. MRI Spine has been negative for neurofibromas. He denies any headaches, seizures. He has never been diagnosed or treated for optic nerve gliomas or pituitary abnormalities. They deny ever  being diagnosed or treated for low grade gliomas. He has multiple cutaneous lesions seen with this condition. However, one in his right low back has shown somewhat unusual growth. It grew  more rapidly in the last 2--3 years. It is painful when he bumps hard against something. He denies any spontaneous pain or radicular symptoms. He also reports pain in the right lateral leg, especially on bumping something. He denies seizures. His highest level of education is high school in a special program. He has dyslexia and severely impaired math skills and learning disabilities commonly seen with this condition.  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 Physical examination is benign with a large right lower trunk plexiform neurofibroma and a tender swelling in the right lateral leg along the superficial peroneal nerve.   Plan:  1. He has had an MRI LS spine recently, I have provided them my card to arrange to have this mailed to me for review. 2. I have requested an MRI Right Leg for evaluation of a possible right leg neurofibroma. He is reportedly severely allergic to contrast, therefore it is being ordered without. Patient and mother would like it done in [place], therefore a prescription was provided for the same. 3. Start Gabapentin 300 mg bid for pain relief. 4. Since he has a large right flank plexiform neurofibroma, I will make a consult request to neurosurgery for the same since these have a risk of malignant transformation. At the current time, most aspects of the clinical exam suggest it is still benign, however I feel a surgical opinion would be very helpful. Will make the referral to [facility].     Review of Systems: Patient complains of symptoms per HPI as well as the following symptoms: Blurred vision, headache, dizziness, ringing in ears, spinning sensation, headache, dizziness. Pertinent negatives per HPI. All others negative.   Social History   Social History  . Marital Status: Single    Spouse Name: N/A  . Number of Children: 0  . Years of Education: 12   Occupational History  . [name]    Social History Main  Topics  . Smoking status: Never Smoker   . Smokeless tobacco: Never Used  . Alcohol Use: No  . Drug Use: No  . Sexual Activity: Not on file   Other Topics Concern  . Not on file   Social History Narrative   Lives with mother   Caffeine use: Coffee at work sometimes   Soda rare     Family History  Problem Relation Age of Onset  . Stroke Father   . Diabetes Father   . Heart attack Father   . High blood pressure Father   . Arthritis Mother   . Suicidality Sister   . Colon cancer      great uncle  . Migraines Neg Hx     Past Medical History  Diagnosis Date  . Hx of neurofibromatosis   . Dyslexia   . Short-term memory loss   . Scoliosis   . Back pain, chronic   . Migraines     Past Surgical History  Procedure Laterality Date  . Hernia repair  1990    as a child 74 months old    Current Outpatient Prescriptions  Medication Sig Dispense Refill  . dextromethorphan (DELSYM) 30 MG/5ML liquid Take 15 mg by mouth daily as needed for cough.    . ondansetron (ZOFRAN-ODT) 8 MG disintegrating tablet Take 1 tablet (8 mg  total) by mouth as needed. 30 tablet 12  . ondansetron (ZOFRAN) 4 MG tablet Take as needed for nausea during MRI 2 tablet 0  . rizatriptan (MAXALT-MLT) 10 MG disintegrating tablet Take 1 tablet (10 mg total) by mouth as needed for migraine. May repeat in 2 hours if needed 10 tablet 11   No current facility-administered medications for this visit.    Allergies as of 01/14/2016 - Review Complete 01/14/2016  Allergen Reaction Noted  . Contrast media [iodinated diagnostic agents]  07/14/2014    Vitals: BP 107/65 mmHg  Pulse 65  Ht 5\' 8"  (1.727 m)  Wt 196 lb (88.905 kg)  BMI 29.81 kg/m2 Last Weight:  Wt Readings from Last 1 Encounters:  01/14/16 196 lb (88.905 kg)   Last Height:   Ht Readings from Last 1 Encounters:  01/14/16 5\' 8"  (1.727 m)   Physical exam: Exam: Gen: NAD, conversant, well nourised, obese, well groomed                     CV:  RRR, no MRG. No Carotid Bruits. No peripheral edema, warm, nontender Eyes: Conjunctivae clear without exudates or hemorrhage Skin: Has freckling over most of his body most pronounced on the torso, axilla and groin,  Multiple caf au lait macules , large plexiform fibroma in the lumbar area right above the gluteal fold, several smaller cutaneous neurofibromas on the head and other cutaneous multiple neurofibromas, possibly deep neurofibroma right leg (tenderness)   Neuro: Detailed Neurologic Exam  Speech:    Speech is normal; fluent and spontaneous with normal comprehension.  Cognition:    The patient is oriented to person, place, and time;     recent and remote memory intact;     language fluent;     normal attention, concentration,     fund of knowledge Cranial Nerves:    The pupils are equal, round, and reactive to light. The fundi are normal and spontaneous venous pulsations are present. Visual fields are full to finger confrontation. Extraocular movements are intact. Trigeminal sensation is intact and the muscles of mastication are normal. The face is symmetric. The palate elevates in the midline. Hearing intact. Voice is normal. Shoulder shrug is normal. The tongue has normal motion without fasciculations.   Coordination:    Normal finger to nose and heel to shin.   Gait:    Heel-toe and tandem gait are normal.   Motor Observation:    No asymmetry, no atrophy, and no involuntary movements noted. Tone:    Normal muscle tone.    Posture:    Posture is normal. normal erect    Strength:    Strength is V/V in the upper and lower limbs.      Sensation: intact to LT     Reflex Exam:  DTR's: Brisker biceps on the left arm otherwise normal and intact.       Toes:    The toes are downgoing bilaterally.   Clonus:    Clonus is absent.       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 Diagnosed at age 53. No known family history. Here for migraines however  he has not followed with neurology for years. MRI of the brain in the past without gliomas but recently seen. He has multiple skin findings. 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 No radicular symptoms. No seizures. He has learning disabilities which is common in this condition,  He comes with a large volume of documents and past medical history which will be summarized as an addendum. Referal to Ophthalmologist, neurosurgy for optic glioma and eval of large plexiform fibroma as well as review of spine which I will order Order imaging of the cervical , thoracic and lumbar spine for surveillance. Also repeat MRI of the brain with contrast to look for enhancement of the optic nerve.  Needs a specialty clinic for his NF1 to be seen every year and in between can follow with me. Refer to [facility] or [facility].  F/u 3 months for one hour    Sarina Ill, MD  [facility] [address] [place], [place] [address]  Phone [phone] Fax [phone]

## 2016-01-14 NOTE — Telephone Encounter (Signed)
Danielle- can  You make sure order gets sent for MRI? Thank you!  Dr Jaynee Eagles- Juluis Rainier

## 2016-01-14 NOTE — Patient Instructions (Addendum)
Remember to drink plenty of fluid, eat healthy meals and do not skip any meals. Try to eat protein with a every meal and eat a healthy snack such as fruit or nuts in between meals. Try to keep a regular sleep-wake schedule and try to exercise daily, particularly in the form of walking, 20-30 minutes a day, if you can.   As far as your medications are concerned, I would like to suggest: At onset of migraine, take zofran and maxalt. May repeat in 2 hours. No more than 2x in one day.  Ophthalmology referral Referral to [facility] or [facility] Will review records  As far as diagnostic testing: MRI of the cervical spine, thoracic spine and lumbar spine  I would like to see you back in 3 months, sooner if we need to. Please call us with any interim questions, concerns, problems, updates or refill requests.   Our phone number is [phone]. We also have an after hours call service for urgent matters and there is a physician on-call for urgent questions. For any emergencies you know to call 911 or go to the nearest emergency room  Rizatriptan disintegrating tablets What is this medicine? RIZATRIPTAN (rye za TRIP tan) is used to treat migraines with or without aura. An aura is a strange feeling or visual disturbance that warns you of an attack. It is not used to prevent migraines. This medicine may be used for other purposes; ask your health care provider or pharmacist if you have questions. What should I tell my health care provider before I take this medicine? They need to know if you have any of these conditions: -bowel disease or colitis -diabetes -family history of heart disease -fast or irregular heart beat -heart or blood vessel disease, angina (chest pain), or previous heart attack -high blood pressure -high cholesterol -history of stroke, transient ischemic attacks (TIAs or mini-strokes), or intracranial bleeding -kidney or liver disease -overweight -poor  circulation -postmenopausal or surgical removal of uterus and ovaries -an unusual or allergic reaction to rizatriptan, other medicines, foods, dyes, or preservatives -pregnant or trying to get pregnant -breast-feeding How should I use this medicine? Take this medicine by mouth. Follow the directions on the prescription label. This medicine is taken at the first symptoms of a migraine. It is not for everyday use. Leave the tablet in the foil package until you are ready to take it. Do not push the tablet through the blister pack. Peel open the blister pack with dry hands and place the tablet on your tongue. The tablet will dissolve rapidly and be swallowed in your saliva. It is not necessary to drink any water to take this medicine. If your migraine headache returns after one dose, you can take another dose as directed. You must leave at least 2 hours between doses, and do not take more than 30 mg total in 24 hours. If there is no improvement at all after the first dose, do not take a second dose without talking to your doctor or health care professional. Do not take your medicine more often than directed. Talk to your pediatrician regarding the use of this medicine in children. While this drug may be prescribed for children as young as 6 years for selected conditions, precautions do apply. Overdosage: If you think you have taken too much of this medicine contact a poison control center or emergency room at once. NOTE: This medicine is only for you. Do not share this medicine with others. What if I miss  a dose? This does not apply; this medicine is not for regular use. What may interact with this medicine? Do not take this medicine with any of the following medicines: -amphetamine, dextroamphetamine or cocaine -dihydroergotamine, ergotamine, ergoloid mesylates, methysergide, or ergot-type medication - do not take within 24 hours of taking rizatriptan -feverfew -MAOIs like Carbex, Eldepryl, Marplan,  Nardil, and Parnate - do not take rizatriptan within 2 weeks of stopping MAOI therapy. -other migraine medicines like almotriptan, eletriptan, naratriptan, sumatriptan, zolmitriptan - do not take within 24 hours of taking rizatriptan -tryptophan This medicine may also interact with the following medications: -medicines for mental depression, anxiety or mood problems -propranolol This list may not describe all possible interactions. Give your health care provider a list of all the medicines, herbs, non-prescription drugs, or dietary supplements you use. Also tell them if you smoke, drink alcohol, or use illegal drugs. Some items may interact with your medicine. What should I watch for while using this medicine? Only take this medicine for a migraine headache. Take it if you get warning symptoms or at the start of a migraine attack. It is not for regular use to prevent migraine attacks. You may get drowsy or dizzy. Do not drive, use machinery, or do anything that needs mental alertness until you know how this medicine affects you. To reduce dizzy or fainting spells, do not sit or stand up quickly, especially if you are an older patient. Alcohol can increase drowsiness, dizziness and flushing. Avoid alcoholic drinks. Smoking cigarettes may increase the risk of heart-related side effects from using this medicine. If you take migraine medicines for 10 or more days a month, your migraines may get worse. Keep a diary of headache days and medicine use. Contact your healthcare professional if your migraine attacks occur more frequently. What side effects may I notice from receiving this medicine? Side effects that you should report to your doctor or health care professional as soon as possible: -allergic reactions like skin rash, itching or hives, swelling of the face, lips, or tongue -fast, slow, or irregular heart beat -increased or decreased blood pressure -seizures -severe stomach pain and cramping,  bloody diarrhea -signs and symptoms of a blood clot such as breathing problems; changes in vision; chest pain; severe, sudden headache; pain, swelling, warmth in the leg; trouble speaking; sudden numbness or weakness of the face, arm or leg -tingling, pain, or numbness in the face, hands, or feet Side effects that usually do not require medical attention (report to your doctor or health care professional if they continue or are bothersome): -drowsiness -dry mouth -feeling warm, flushing, or redness of the face -headache -muscle cramps, pain -nausea, vomiting -unusually weak or tired This list may not describe all possible side effects. Call your doctor for medical advice about side effects. You may report side effects to FDA at 1-800-FDA-1088. Where should I keep my medicine? Keep out of the reach of children. Store at room temperature between 15 and 30 degrees C (59 and 86 degrees F). Protect from light and moisture. Throw away any unused medicine after the expiration date. NOTE: This sheet is a summary. It may not cover all possible information. If you have questions about this medicine, talk to your doctor, pharmacist, or health care provider.    2016, [employer]vier/[employer] Standard. (2013-02-19 10:17:42)

## 2016-01-14 NOTE — Telephone Encounter (Signed)
Patient called, was seen today by Dr. Jaynee Eagles, wants to advise Dr. Jaynee Eagles that he was unable to schedule MRI, will need our help to schedule this. Please call [phone].

## 2016-01-14 NOTE — Telephone Encounter (Signed)
Thanks Danielle! He has an appointment with Neurosurgery next Tuesday and hoping to get these done by then if possible thanks

## 2016-01-14 NOTE — Telephone Encounter (Signed)
Spoke with the patient who stated that he would have to talk to his mom about his MRI scheduling. Called the mom's phone and left a VM asking her to call me back.

## 2016-01-15 MED FILL — ONDANSETRON ODT 8 MG TABLET: 8 | 30 days supply | Qty: 30 | Fill #0

## 2016-01-15 MED FILL — RIZATRIPTAN 10 MG ODT: 10 | 30 days supply | Qty: 9 | Fill #0

## 2016-01-15 NOTE — Telephone Encounter (Signed)
I got the patient scheduled for all four MRI's Monday morning at a [place] facility. Called the patient to inform him, left him a VM. Called his mother and she stated that he could not do the MRI's on Monday because he has to work. She requested that I call the hospital and schedule with them at their earliest time. His mother stated that they would move the other apt. I called [facility] and got the patient scheduled for Tuesday the 18th at #. Relayed this information you his mother.

## 2016-01-19 ENCOUNTER — Ambulatory Visit (HOSPITAL_COMMUNITY): Admission: RE | Admit: 2016-01-19 | Payer: 59 | Source: Ambulatory Visit

## 2016-01-19 ENCOUNTER — Other Ambulatory Visit: Payer: Self-pay | Admitting: Neurology

## 2016-01-19 ENCOUNTER — Ambulatory Visit (HOSPITAL_COMMUNITY): Payer: 59

## 2016-01-19 ENCOUNTER — Ambulatory Visit (HOSPITAL_COMMUNITY)
Admission: RE | Admit: 2016-01-19 | Discharge: 2016-01-19 | Disposition: A | Discharge status: Home / Self Care | Payer: 59 | Source: Ambulatory Visit | Attending: Neurology | Admitting: Neurology

## 2016-01-19 ENCOUNTER — Telehealth: Payer: Self-pay

## 2016-01-19 DIAGNOSIS — R29898 Other symptoms and signs involving the musculoskeletal system: Secondary | ICD-10-CM

## 2016-01-19 DIAGNOSIS — R519 Headache, unspecified: Secondary | ICD-10-CM

## 2016-01-19 DIAGNOSIS — R51 Headache: Secondary | ICD-10-CM

## 2016-01-19 DIAGNOSIS — H539 Unspecified visual disturbance: Secondary | ICD-10-CM

## 2016-01-19 DIAGNOSIS — M5441 Lumbago with sciatica, right side: Secondary | ICD-10-CM

## 2016-01-19 DIAGNOSIS — D334 Benign neoplasm of spinal cord: Secondary | ICD-10-CM

## 2016-01-19 DIAGNOSIS — C719 Malignant neoplasm of brain, unspecified: Secondary | ICD-10-CM

## 2016-01-19 DIAGNOSIS — M542 Cervicalgia: Secondary | ICD-10-CM

## 2016-01-19 DIAGNOSIS — Q8500 Neurofibromatosis, unspecified: Secondary | ICD-10-CM

## 2016-01-19 DIAGNOSIS — C7230 Malignant neoplasm of unspecified optic nerve: Secondary | ICD-10-CM

## 2016-01-19 MED ORDER — ONDANSETRON HCL 4 MG PO TABS: ORAL_TABLET | ORAL | Status: DC

## 2016-01-19 MED FILL — ONDANSETRON HCL 4 MG TABLET: 4 | 1 days supply | Qty: 2 | Fill #0

## 2016-01-19 NOTE — Telephone Encounter (Signed)
Mother called in saying that they are at MRI appt and was told a medication would be called in for patient's reaction to contrast. Upon further questioning the adverse reaction is nausea. Per Dr. Rexene Alberts, Rx called in for patient. Rx sent in

## 2016-01-21 ENCOUNTER — Ambulatory Visit (HOSPITAL_COMMUNITY)
Admission: RE | Admit: 2016-01-21 | Discharge: 2016-01-21 | Disposition: A | Discharge status: Home / Self Care | Payer: 59 | Source: Ambulatory Visit | Attending: Neurology | Admitting: Neurology

## 2016-01-21 ENCOUNTER — Ambulatory Visit (HOSPITAL_COMMUNITY): Payer: 59

## 2016-01-21 DIAGNOSIS — R519 Headache, unspecified: Secondary | ICD-10-CM

## 2016-01-21 DIAGNOSIS — Q8500 Neurofibromatosis, unspecified: Secondary | ICD-10-CM | POA: Diagnosis not present

## 2016-01-21 DIAGNOSIS — C7230 Malignant neoplasm of unspecified optic nerve: Secondary | ICD-10-CM

## 2016-01-21 DIAGNOSIS — C719 Malignant neoplasm of brain, unspecified: Secondary | ICD-10-CM

## 2016-01-21 DIAGNOSIS — R29898 Other symptoms and signs involving the musculoskeletal system: Secondary | ICD-10-CM

## 2016-01-21 DIAGNOSIS — H539 Unspecified visual disturbance: Secondary | ICD-10-CM

## 2016-01-21 DIAGNOSIS — M5441 Lumbago with sciatica, right side: Secondary | ICD-10-CM

## 2016-01-21 DIAGNOSIS — R51 Headache: Secondary | ICD-10-CM

## 2016-01-21 DIAGNOSIS — C7259 Malignant neoplasm of other cranial nerves: Secondary | ICD-10-CM | POA: Diagnosis not present

## 2016-01-21 DIAGNOSIS — M50321 Other cervical disc degeneration at C4-C5 level: Secondary | ICD-10-CM | POA: Diagnosis not present

## 2016-01-21 DIAGNOSIS — M542 Cervicalgia: Secondary | ICD-10-CM

## 2016-01-21 DIAGNOSIS — D334 Benign neoplasm of spinal cord: Secondary | ICD-10-CM | POA: Insufficient documentation

## 2016-01-21 DIAGNOSIS — M50322 Other cervical disc degeneration at C5-C6 level: Secondary | ICD-10-CM | POA: Diagnosis not present

## 2016-01-21 DIAGNOSIS — M4802 Spinal stenosis, cervical region: Secondary | ICD-10-CM | POA: Insufficient documentation

## 2016-01-21 MED ORDER — GADOBENATE DIMEGLUMINE 529 MG/ML IV SOLN
20.0000 mL | Freq: Once | INTRAVENOUS | Status: AC | PRN
  Administered 2016-01-21: 18 mL via INTRAVENOUS

## 2016-01-22 ENCOUNTER — Other Ambulatory Visit: Payer: Self-pay | Admitting: Neurology

## 2016-01-22 ENCOUNTER — Telehealth: Payer: Self-pay | Admitting: Neurology

## 2016-01-22 MED ORDER — ONDANSETRON HCL 4 MG PO TABS: ORAL_TABLET | ORAL | Status: DC

## 2016-01-22 MED FILL — ONDANSETRON HCL 4 MG TABLET: 4 | 1 days supply | Qty: 2 | Fill #0

## 2016-01-22 NOTE — Telephone Encounter (Signed)
Gary Dorsey needed more Zofran for his spine MRIs scheduled this weekend. He had his other MRIs performed a couple days ago. He did Zofran for nausea associated with contrast.  New prescription sent into [facility] outpatient pharmacy.

## 2016-01-23 ENCOUNTER — Encounter: Payer: Self-pay | Admitting: Neurology

## 2016-01-23 DIAGNOSIS — G43909 Migraine, unspecified, not intractable, without status migrainosus: Secondary | ICD-10-CM | POA: Insufficient documentation

## 2016-01-23 DIAGNOSIS — Q8500 Neurofibromatosis, unspecified: Secondary | ICD-10-CM | POA: Insufficient documentation

## 2016-01-23 DIAGNOSIS — Q8501 Neurofibromatosis, type 1: Secondary | ICD-10-CM | POA: Insufficient documentation

## 2016-01-24 ENCOUNTER — Ambulatory Visit (HOSPITAL_COMMUNITY): Admission: RE | Admit: 2016-01-24 | Payer: 59 | Source: Ambulatory Visit

## 2016-01-24 ENCOUNTER — Other Ambulatory Visit (HOSPITAL_COMMUNITY): Payer: 59

## 2016-01-24 ENCOUNTER — Ambulatory Visit (HOSPITAL_COMMUNITY)
Admission: RE | Admit: 2016-01-24 | Discharge: 2016-01-24 | Disposition: A | Discharge status: Home / Self Care | Payer: 59 | Source: Ambulatory Visit | Attending: Neurology | Admitting: Neurology

## 2016-01-24 DIAGNOSIS — M5126 Other intervertebral disc displacement, lumbar region: Secondary | ICD-10-CM | POA: Diagnosis not present

## 2016-01-24 DIAGNOSIS — Q8500 Neurofibromatosis, unspecified: Secondary | ICD-10-CM | POA: Diagnosis not present

## 2016-01-24 DIAGNOSIS — R51 Headache: Secondary | ICD-10-CM | POA: Diagnosis not present

## 2016-01-24 DIAGNOSIS — R29898 Other symptoms and signs involving the musculoskeletal system: Secondary | ICD-10-CM | POA: Insufficient documentation

## 2016-01-24 DIAGNOSIS — M5441 Lumbago with sciatica, right side: Secondary | ICD-10-CM | POA: Diagnosis not present

## 2016-01-24 DIAGNOSIS — M5136 Other intervertebral disc degeneration, lumbar region: Secondary | ICD-10-CM | POA: Diagnosis not present

## 2016-01-24 DIAGNOSIS — C7259 Malignant neoplasm of other cranial nerves: Secondary | ICD-10-CM | POA: Insufficient documentation

## 2016-01-24 DIAGNOSIS — M542 Cervicalgia: Secondary | ICD-10-CM | POA: Diagnosis not present

## 2016-01-24 DIAGNOSIS — H539 Unspecified visual disturbance: Secondary | ICD-10-CM | POA: Insufficient documentation

## 2016-01-24 MED ORDER — GADOBENATE DIMEGLUMINE 529 MG/ML IV SOLN
18.0000 mL | Freq: Once | INTRAVENOUS | Status: AC | PRN
  Administered 2016-01-24: 18 mL via INTRAVENOUS

## 2016-01-25 ENCOUNTER — Telehealth: Payer: Self-pay | Admitting: *Deleted

## 2016-01-25 NOTE — Telephone Encounter (Signed)
-----   Message from Melvenia Beam, MD sent at 01/25/2016  2:41 PM EDT ----- Terrence Dupont, Patient's MRI of the lumbar spine shows some worsening arthritic changes at L4-L5 but no nerve pinching or any compromise on the nerve roots. 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 He should have neurosurgery review all these films thanks.

## 2016-01-25 NOTE — Telephone Encounter (Signed)
Pt called office back. Relayed results per Dr Jaynee Eagles note about MRI cervical/brain. He verbalized understanding.

## 2016-01-25 NOTE — Telephone Encounter (Signed)
Called and relayed Dr Cathren Laine message. He verbalized understanding and stated he is following w/ neurosurgery. He is going back for MRI spine on 7/27. Told him to call if he has further questions.

## 2016-01-25 NOTE — Telephone Encounter (Signed)
LVM for pt to call about MRI results. Gave [facility] phone number.

## 2016-01-27 ENCOUNTER — Telehealth: Payer: Self-pay | Admitting: Neurology

## 2016-01-27 ENCOUNTER — Other Ambulatory Visit: Payer: Self-pay | Admitting: Neurology

## 2016-01-27 DIAGNOSIS — R1115 Cyclical vomiting syndrome unrelated to migraine: Secondary | ICD-10-CM

## 2016-01-27 MED ORDER — ONDANSETRON 4 MG PO TBDP: ORAL_TABLET | ORAL | 2 refills | Status: DC

## 2016-01-27 MED FILL — ONDANSETRON ODT 4 MG TABLET: 4 | 5 days supply | Qty: 10 | Fill #0

## 2016-01-27 NOTE — Telephone Encounter (Signed)
Spoke to pt - states he has experienced nausea and vomiting in the past with MRI contrast.  He denies any other side effects in the past.  He would like a medication to counteract these symptoms.

## 2016-01-27 NOTE — Telephone Encounter (Signed)
Sent it in to [facility] pharmacy thanks

## 2016-01-27 NOTE — Telephone Encounter (Signed)
Pt is requesting medication to keep from getting sick while having MRI done tomorrow. [phone]

## 2016-01-27 NOTE — Telephone Encounter (Signed)
Dr Jaynee Eagles- you ordered imaging w/ contrast. Gary Dorsey spoke to pt about reaction w/ contrast. See below, please advise

## 2016-01-28 ENCOUNTER — Telehealth: Payer: Self-pay | Admitting: *Deleted

## 2016-01-28 ENCOUNTER — Ambulatory Visit (HOSPITAL_COMMUNITY)
Admission: RE | Admit: 2016-01-28 | Discharge: 2016-01-28 | Disposition: A | Discharge status: Home / Self Care | Payer: 59 | Source: Ambulatory Visit | Attending: Neurology | Admitting: Neurology

## 2016-01-28 DIAGNOSIS — M5441 Lumbago with sciatica, right side: Secondary | ICD-10-CM | POA: Insufficient documentation

## 2016-01-28 DIAGNOSIS — H539 Unspecified visual disturbance: Secondary | ICD-10-CM | POA: Insufficient documentation

## 2016-01-28 DIAGNOSIS — C7230 Malignant neoplasm of unspecified optic nerve: Secondary | ICD-10-CM | POA: Diagnosis not present

## 2016-01-28 DIAGNOSIS — Q8500 Neurofibromatosis, unspecified: Secondary | ICD-10-CM | POA: Diagnosis not present

## 2016-01-28 DIAGNOSIS — R29898 Other symptoms and signs involving the musculoskeletal system: Secondary | ICD-10-CM | POA: Insufficient documentation

## 2016-01-28 DIAGNOSIS — M542 Cervicalgia: Secondary | ICD-10-CM | POA: Diagnosis not present

## 2016-01-28 DIAGNOSIS — Z0000 Encounter for general adult medical examination without abnormal findings: Secondary | ICD-10-CM | POA: Diagnosis not present

## 2016-01-28 DIAGNOSIS — R51 Headache: Secondary | ICD-10-CM | POA: Diagnosis not present

## 2016-01-28 DIAGNOSIS — M545 Low back pain: Secondary | ICD-10-CM | POA: Diagnosis not present

## 2016-01-28 MED ORDER — GADOBENATE DIMEGLUMINE 529 MG/ML IV SOLN
20.0000 mL | Freq: Once | INTRAVENOUS | Status: AC | PRN
  Administered 2016-01-28: 18 mL via INTRAVENOUS

## 2016-01-28 NOTE — Telephone Encounter (Signed)
-----   Message from Melvenia Beam, MD sent at 01/28/2016 10:05 AM EDT ----- MRI of the thoracic spine looks fine thanks

## 2016-01-28 NOTE — Telephone Encounter (Signed)
Called and spoke to pt about MRI results per Dr Jaynee Eagles note. He verbalized understanding and has no further questions at this time.

## 2016-02-02 DIAGNOSIS — Z6830 Body mass index (BMI) 30.0-30.9, adult: Secondary | ICD-10-CM | POA: Diagnosis not present

## 2016-02-02 DIAGNOSIS — Z0000 Encounter for general adult medical examination without abnormal findings: Secondary | ICD-10-CM | POA: Diagnosis not present

## 2016-02-02 DIAGNOSIS — Z1389 Encounter for screening for other disorder: Secondary | ICD-10-CM | POA: Diagnosis not present

## 2016-02-02 DIAGNOSIS — Q8501 Neurofibromatosis, type 1: Secondary | ICD-10-CM | POA: Diagnosis not present

## 2016-02-02 DIAGNOSIS — G43909 Migraine, unspecified, not intractable, without status migrainosus: Secondary | ICD-10-CM | POA: Diagnosis not present

## 2016-02-02 DIAGNOSIS — C7230 Malignant neoplasm of unspecified optic nerve: Secondary | ICD-10-CM | POA: Diagnosis not present

## 2016-02-11 DIAGNOSIS — C7230 Malignant neoplasm of unspecified optic nerve: Secondary | ICD-10-CM | POA: Diagnosis not present

## 2016-02-11 DIAGNOSIS — Q8501 Neurofibromatosis, type 1: Secondary | ICD-10-CM | POA: Diagnosis not present

## 2016-03-10 MED FILL — AMOXICILLIN 500 MG CAPSULE: 500 | 7 days supply | Qty: 21 | Fill #0

## 2016-03-10 MED FILL — diazePAM 5 MG TABS: 5 | 1 days supply | Qty: 1 | Fill #0

## 2016-03-10 MED FILL — IBUPROFEN 800 MG TABLET: 800 | 7 days supply | Qty: 21 | Fill #0

## 2016-03-10 MED FILL — OXYCODONE/APAP 5-325: 5-325 | 8 days supply | Qty: 24 | Fill #0

## 2016-03-10 MED FILL — METHYLPREDNISOLONE 4 MG TAB: 4 | 6 days supply | Qty: 21 | Fill #0

## 2016-03-30 ENCOUNTER — Encounter: Payer: Self-pay | Admitting: Neurology

## 2016-04-21 ENCOUNTER — Ambulatory Visit: Payer: 59 | Admitting: Neurology

## 2016-04-26 ENCOUNTER — Telehealth: Payer: Self-pay | Admitting: Neurology

## 2016-04-26 MED FILL — RIZATRIPTAN 10 MG ODT: 10 | 30 days supply | Qty: 9 | Fill #1

## 2016-04-26 NOTE — Telephone Encounter (Signed)
Called pharmacy again and spoke with Caryl Pina. She verified pt has refills on rizatriptan. I called patient and relayed message. He verbalized understanding.

## 2016-04-26 NOTE — Telephone Encounter (Signed)
Called pt pharmacy. LVM for them to call back and verify if there are any refills on rx rizatriptan. Last sent on 01/14/16 with 11 refills. Gave [facility] phone number   Office [phone] Monday-Friday

## 2016-04-26 NOTE — Telephone Encounter (Signed)
Patient requesting refill of rizatriptan (MAXALT-MLT) 10 MG disintegrating tablet to be called to [facility].

## 2016-07-28 ENCOUNTER — Other Ambulatory Visit (HOSPITAL_COMMUNITY): Payer: Self-pay | Admitting: Neurosurgery

## 2016-07-28 DIAGNOSIS — Q8501 Neurofibromatosis, type 1: Secondary | ICD-10-CM

## 2016-08-05 ENCOUNTER — Ambulatory Visit (HOSPITAL_COMMUNITY): Admission: RE | Admit: 2016-08-05 | Payer: 59 | Source: Ambulatory Visit

## 2016-08-18 ENCOUNTER — Ambulatory Visit (HOSPITAL_COMMUNITY): Payer: 59

## 2016-08-19 MED FILL — predniSONE 50 MG TABS: 50 | 2 days supply | Qty: 3 | Fill #0

## 2016-08-25 ENCOUNTER — Ambulatory Visit (HOSPITAL_COMMUNITY)
Admission: RE | Admit: 2016-08-25 | Discharge: 2016-08-25 | Disposition: A | Discharge status: Home / Self Care | Payer: 59 | Source: Ambulatory Visit | Attending: Neurosurgery | Admitting: Neurosurgery

## 2016-08-25 DIAGNOSIS — Q8501 Neurofibromatosis, type 1: Secondary | ICD-10-CM | POA: Diagnosis not present

## 2016-08-30 DIAGNOSIS — Z6830 Body mass index (BMI) 30.0-30.9, adult: Secondary | ICD-10-CM | POA: Diagnosis not present

## 2016-08-30 DIAGNOSIS — Q8501 Neurofibromatosis, type 1: Secondary | ICD-10-CM | POA: Diagnosis not present

## 2016-08-30 DIAGNOSIS — C7230 Malignant neoplasm of unspecified optic nerve: Secondary | ICD-10-CM | POA: Diagnosis not present

## 2016-11-09 ENCOUNTER — Encounter (HOSPITAL_COMMUNITY): Payer: Self-pay | Admitting: *Deleted

## 2016-11-09 DIAGNOSIS — H6592 Unspecified nonsuppurative otitis media, left ear: Secondary | ICD-10-CM | POA: Diagnosis not present

## 2016-11-09 DIAGNOSIS — H65192 Other acute nonsuppurative otitis media, left ear: Secondary | ICD-10-CM | POA: Diagnosis not present

## 2016-11-09 DIAGNOSIS — H938X9 Other specified disorders of ear, unspecified ear: Secondary | ICD-10-CM | POA: Diagnosis present

## 2016-11-09 NOTE — ED Triage Notes (Signed)
The pt is c/o lt ear pain since yesterday

## 2016-11-10 ENCOUNTER — Emergency Department (HOSPITAL_COMMUNITY)
Admission: EM | Admit: 2016-11-10 | Discharge: 2016-11-10 | Disposition: A | Discharge status: Home / Self Care | Payer: 59 | Attending: Emergency Medicine | Admitting: Emergency Medicine

## 2016-11-10 DIAGNOSIS — H6592 Unspecified nonsuppurative otitis media, left ear: Secondary | ICD-10-CM

## 2016-11-10 DIAGNOSIS — H7292 Unspecified perforation of tympanic membrane, left ear: Secondary | ICD-10-CM

## 2016-11-10 MED FILL — OFLOXACIN 0.3% EAR DROPS: 0.3 | 5 days supply | Qty: 5 | Fill #0

## 2016-11-10 NOTE — ED Provider Notes (Signed)
[place] DEPT Provider Note   CSN: [id number] Arrival date & time: 11/09/16  2312  By signing my name below, I, Oleh Genin, attest that this documentation has been prepared under the direction and in the presence of No att. providers found. Electronically Signed: Oleh Genin, Scribe. 11/14/16. 1:24 AM.   History   Chief Complaint Chief Complaint  Patient presents with  . Ear Fullness    HPI Gary Dorsey is a 26 y.o. male with history of migraines presents to the ED for evaluation of ear pain. This patient steat hat in the last 24 hours he has experienced "pressure" inside the L ear which he describes as a sensation of foreign body. He has also had decreased hearing. However no drainage. No tinnitus. No swimming. No recent cough or rhinorrhea, sore throat. No trauma.  The history is provided by the patient. No language interpreter was used.  Otalgia  The current episode started yesterday. There is pain in the left ear. The problem occurs constantly. The problem has not changed since onset.There has been no fever.    Past Medical History:  Diagnosis Date  . Back pain, chronic   . Dyslexia   . Hx of neurofibromatosis ([place])   . Migraines   . Scoliosis   . Short-term memory loss     Patient Active Problem List   Diagnosis Date Noted  . 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 Migraine 01/23/2016  . Chest pain 08/15/2014  . Pleural effusion 07/14/2014  . Cough 07/14/2014  . Neurofibroma 07/14/2014    Past Surgical History:  Procedure Laterality Date  . [place]   as a child 28 months old       Home Medications    Prior to Admission medications   Medication Sig Start Date End Date Taking? Authorizing Provider  dextromethorphan (DELSYM) 30 MG/5ML liquid Take 15 mg by mouth daily as needed for cough.    [provider]  ondansetron (ZOFRAN ODT) 4 MG disintegrating tablet Take 1-2 30 minutes before MRI may repeat if  needed. 01/27/16   Melvenia Beam, MD  ondansetron [facility]) 4 MG tablet Take as needed for nausea during MRI 01/22/16   Sater, Nanine Means, MD  rizatriptan (MAXALT-MLT) 10 MG disintegrating tablet Take 1 tablet (10 mg total) by mouth as needed for migraine. May repeat in 2 hours if needed 01/14/16   Melvenia Beam, MD    Family History Family History  Problem Relation Age of Onset  . Arthritis Mother   . Stroke Father   . Diabetes Father   . Heart attack Father   . High blood pressure Father   . Suicidality Sister   . Colon cancer Unknown        great uncle  . Migraines Neg Hx     Social History Social History  Substance Use Topics  . Smoking status: Never Smoker  . Smokeless tobacco: Never Used  . Alcohol use No     Allergies   Contrast media [iodinated diagnostic agents]   Review of Systems Review of Systems All systems reviewed and are negative for acute change except as noted in the HPI.  Physical Exam Updated Vital Signs BP 134/85   Pulse 70   Temp 98.7 F (37.1 C)   Resp 16   Ht 5\' 8"  (1.727 m)   Wt 194 lb 7 oz (88.2 kg)   SpO2 98%   BMI 29.56 kg/m   Physical Exam  Constitutional: He is  oriented to person, place, and time. He appears well-developed and well-nourished.  HENT:  Head: Normocephalic and atraumatic.  In the Right ear, there is a normal canal. Normal light reflex from TM. In the Left ear, there is a light reflex at the 10 o'clock position. However the TM is not visualized in the entirety. Suspicion for ruptured TM versus very dull membrane.  Cardiovascular: Normal rate.   Pulmonary/Chest: Effort normal.  Neurological: He is alert and oriented to person, place, and time.  Skin: Skin is warm and dry.  Psychiatric: He has a normal mood and affect.  Nursing note and vitals reviewed.    ED Treatments / Results  Labs (all labs ordered are listed, but only abnormal results are displayed) Labs Reviewed - No data to display  EKG  EKG  Interpretation None       Radiology No results found.  Procedures Procedures (including critical care time)  Medications Ordered in ED Medications - No data to display   Initial Impression / Assessment and Plan / ED Course  I have reviewed the triage vital signs and the nursing notes.  Pertinent labs & imaging results that were available during my care of the patient were reviewed by me and considered in my medical decision making (see chart for details).     Based on symptoms of ear pain with abnormal sounds and tinnitus, and exam not showing clearly delineated TM-  I suspect TM ruptured and AOM. ENT f/u provided.  Final Clinical Impressions(s) / ED Diagnoses   Final diagnoses:  Otitis media, serous, tm rupture, left    New Prescriptions Discharge Medication List as of 11/10/2016  3:12 AM     I personally performed the services described in this documentation, which was scribed in my presence. The recorded information has been reviewed and is accurate.    Varney Biles, MD 11/14/16 [phone]

## 2016-11-14 DIAGNOSIS — H6690 Otitis media, unspecified, unspecified ear: Secondary | ICD-10-CM | POA: Diagnosis not present

## 2016-11-14 DIAGNOSIS — Q8501 Neurofibromatosis, type 1: Secondary | ICD-10-CM | POA: Diagnosis not present

## 2016-11-14 DIAGNOSIS — Z1389 Encounter for screening for other disorder: Secondary | ICD-10-CM | POA: Diagnosis not present

## 2016-11-14 DIAGNOSIS — Z6830 Body mass index (BMI) 30.0-30.9, adult: Secondary | ICD-10-CM | POA: Diagnosis not present

## 2016-11-14 MED FILL — OFLOXACIN 0.3% EAR DROPS: 0.3 | 7 days supply | Qty: 5 | Fill #0

## 2016-11-14 MED FILL — AMOX-CLAV 875-125 MG TABLET: 875-125 | 10 days supply | Qty: 20 | Fill #0

## 2016-12-06 DIAGNOSIS — Z6830 Body mass index (BMI) 30.0-30.9, adult: Secondary | ICD-10-CM | POA: Diagnosis not present

## 2016-12-06 DIAGNOSIS — H9191 Unspecified hearing loss, right ear: Secondary | ICD-10-CM | POA: Diagnosis not present

## 2016-12-06 DIAGNOSIS — H9311 Tinnitus, right ear: Secondary | ICD-10-CM | POA: Diagnosis not present

## 2017-01-03 DIAGNOSIS — H66003 Acute suppurative otitis media without spontaneous rupture of ear drum, bilateral: Secondary | ICD-10-CM | POA: Insufficient documentation

## 2017-01-03 DIAGNOSIS — Z8669 Personal history of other diseases of the nervous system and sense organs: Secondary | ICD-10-CM | POA: Diagnosis not present

## 2017-07-15 ENCOUNTER — Encounter (HOSPITAL_COMMUNITY): Payer: Self-pay | Admitting: Emergency Medicine

## 2017-07-15 ENCOUNTER — Emergency Department (HOSPITAL_COMMUNITY)
Admission: EM | Admit: 2017-07-15 | Discharge: 2017-07-15 | Disposition: A | Discharge status: Home / Self Care | Payer: 59 | Attending: Emergency Medicine | Admitting: Emergency Medicine

## 2017-07-15 ENCOUNTER — Other Ambulatory Visit: Payer: Self-pay

## 2017-07-15 DIAGNOSIS — Z79899 Other long term (current) drug therapy: Secondary | ICD-10-CM | POA: Diagnosis not present

## 2017-07-15 DIAGNOSIS — M5417 Radiculopathy, lumbosacral region: Secondary | ICD-10-CM | POA: Insufficient documentation

## 2017-07-15 DIAGNOSIS — M545 Low back pain: Secondary | ICD-10-CM | POA: Diagnosis present

## 2017-07-15 MED ORDER — PREDNISONE 10 MG (21) PO TBPK
ORAL_TABLET | Freq: Every day | ORAL | 0 refills | Status: DC
Start: 2017-07-15 — End: 2023-06-19

## 2017-07-15 MED ORDER — KETOROLAC TROMETHAMINE 60 MG/2ML IM SOLN
30.0000 mg | Freq: Once | INTRAMUSCULAR | Status: AC
  Administered 2017-07-15: 30 mg via INTRAMUSCULAR
  Filled 2017-07-15: qty 2

## 2017-07-15 MED ORDER — METHOCARBAMOL 500 MG PO TABS
750.0000 mg | ORAL_TABLET | Freq: Once | ORAL | Status: AC
  Administered 2017-07-15: 750 mg via ORAL
  Filled 2017-07-15: qty 2

## 2017-07-15 MED ORDER — OXYCODONE-ACETAMINOPHEN 5-325 MG PO TABS
1.0000 | ORAL_TABLET | Freq: Once | ORAL | Status: AC
  Administered 2017-07-15: 1 via ORAL
  Filled 2017-07-15: qty 1

## 2017-07-15 MED ORDER — OXYCODONE-ACETAMINOPHEN 5-325 MG PO TABS: 1.0000 | ORAL_TABLET | ORAL | 0 refills | Status: DC | PRN

## 2017-07-15 MED ORDER — METHOCARBAMOL 750 MG PO TABS: 750.0000 mg | ORAL_TABLET | Freq: Two times a day (BID) | ORAL | 0 refills | Status: DC

## 2017-07-15 NOTE — ED Provider Notes (Signed)
[place] EMERGENCY DEPARTMENT Provider Note   CSN: [id number] Arrival date & time: 07/15/17  1236     History   Chief Complaint Chief Complaint  Patient presents with  . Back Pain  . Neurofibromatosis    HPI Doniven Vanpatten is a 27 y.o. male.  HPI Attilio Zeitler is a 27 y.o. male with hx of neurobibromatosis, scoliosis, migraine headaches, presents to ED with complaint of back pain.  Patient states that 2 days ago he was leaning forward and suddenly felt sharp pain in his lower back that "brought me down to my knees."  He states since then he has had severe pain in lower back that radiates down bilateral thighs, right more than left.  He denies any numbness or weakness in his legs.  He states any movement to making his pain worse.  Nothing makes it better.  He has tried heating pads, tried oxycodone 10 mg and Flexeril that his mom gave him, all with no relief.  Patient states he has had prior back issues due to his fibromatosis.  He is currently not followed by a back specialist.  He denies any fever or chills.  No trouble controlling his bowels or bladder.  No other complaints.  Past Medical History:  Diagnosis Date  . Back pain, chronic   . Dyslexia   . Hx of neurofibromatosis ([place])   . Migraines   . Scoliosis   . Short-term memory loss     Patient Active Problem List   Diagnosis Date Noted  . 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 Migraine 01/23/2016  . Chest pain 08/15/2014  . Pleural effusion 07/14/2014  . Cough 07/14/2014  . Neurofibroma 07/14/2014    Past Surgical History:  Procedure Laterality Date  . [place]   as a child 80 months old       Home Medications    Prior to Admission medications   Medication Sig Start Date End Date Taking? Authorizing Provider  aspirin-acetaminophen-caffeine (EXCEDRIN MIGRAINE) [phone] MG tablet Take 2 tablets by mouth every 6 (six) hours as needed for headache.   Yes [provider]  ondansetron (ZOFRAN) 4 MG tablet Take as needed for nausea during MRI Patient not taking: Reported on 07/15/2017 01/22/16   Sater, Nanine Means, MD  rizatriptan (MAXALT-MLT) 10 MG disintegrating tablet Take 1 tablet (10 mg total) by mouth as needed for migraine. May repeat in 2 hours if needed Patient not taking: Reported on 07/15/2017 01/14/16   Melvenia Beam, MD    Family History Family History  Problem Relation Age of Onset  . Arthritis Mother   . Stroke Father   . Diabetes Father   . Heart attack Father   . High blood pressure Father   . Suicidality Sister   . Colon cancer Unknown        great uncle  . Migraines Neg Hx     Social History Social History   Tobacco Use  . Smoking status: Never Smoker  . Smokeless tobacco: Never Used  Substance Use Topics  . Alcohol use: No    Alcohol/week: 0.0 oz  . Drug use: No     Allergies   Contrast media [iodinated diagnostic agents]   Review of Systems Review of Systems  Constitutional: Negative for chills and fever.  Respiratory: Negative for cough, chest tightness and shortness of breath.   Cardiovascular: Negative for chest pain, palpitations and leg swelling.  Gastrointestinal: Negative for abdominal distention, abdominal pain,  diarrhea, nausea and vomiting.  Genitourinary: Negative for dysuria, frequency, hematuria and urgency.  Musculoskeletal: Positive for arthralgias and back pain. Negative for myalgias, neck pain and neck stiffness.  Skin: Negative for rash.  Allergic/Immunologic: Negative for immunocompromised state.  Neurological: Negative for dizziness, weakness, light-headedness and numbness.  All other systems reviewed and are negative.    Physical Exam Updated Vital Signs BP 140/79 (BP Location: Right Arm)   Pulse 92   Temp 99 F (37.2 C) (Oral)   Resp 17   Ht 5\' 8"  (1.727 m)   Wt 86.2 kg (190 lb)   SpO2 100%   BMI 28.89 kg/m   Physical Exam  Constitutional: He appears well-developed  and well-nourished. No distress.  Eyes: Conjunctivae are normal.  Neck: Neck supple.  Cardiovascular: Normal rate.  Pulmonary/Chest: No respiratory distress.  Abdominal: He exhibits no distension.  Musculoskeletal:  Midline lumbar spine tenderness. Pain with bilateral leg raise. Multiple neurofibromas, with a very large one located to the right lower back over SI joint  Neurological: He is alert.  5/5 and equal lower extremity strength. 2+ and equal patellar reflexes bilaterally. Pt able to dorsiflex bilateral toes and feet with good strength against resistance. Equal sensation bilaterally over thighs and lower legs.   Skin: Skin is warm and dry.  Nursing note and vitals reviewed.    ED Treatments / Results  Labs (all labs ordered are listed, but only abnormal results are displayed) Labs Reviewed - No data to display  EKG  EKG Interpretation None       Radiology No results found.  Procedures Procedures (including critical care time)  Medications Ordered in ED Medications  ketorolac (TORADOL) injection 30 mg (not administered)  oxyCODONE-acetaminophen (PERCOCET/ROXICET) 5-325 MG per tablet 1 tablet (not administered)  methocarbamol (ROBAXIN) tablet 750 mg (not administered)     Initial Impression / Assessment and Plan / ED Course  I have reviewed the triage vital signs and the nursing notes.  Pertinent labs & imaging results that were available during my care of the patient were reviewed by me and considered in my medical decision making (see chart for details).     Patient in emergency department with sudden onset of severe lower back pain radiating down both legs, right worse than left, onset 2 days ago.  From history and physical exam, no concern for cauda equina or cord compression at this time.  I reviewed his MRI from 2017, he does have degenerative disc disease, annular fissure and bulging at L4-L5, and evidence of plexiform neurofibromas of the nerve roots and  lumbosacral plexus.  Suspect his pain could be caused by nerve compression or inflammation from his bulging disc or from neurofibromas.  At this time I do not think he needs any emergent imaging.  Will try treating him with steroids, pain medications, muscle relaxants.  He will follow-up with his family doctor this week.  We discussed return precautions that should prompt his return to emergency department.  Patient agreed to the plan.  Vitals:   07/15/17 1241  BP: 140/79  Pulse: 92  Resp: 17  Temp: 99 F (37.2 C)  TempSrc: Oral  SpO2: 100%  Weight: 86.2 kg (190 lb)  Height: 5\' 8"  (1.727 m)     Final Clinical Impressions(s) / ED Diagnoses   Final diagnoses:  Lumbosacral radiculopathy    ED Discharge Orders    None       Jeannett Senior, PA-C 07/16/17 0004    Gareth Morgan, MD 07/16/17  1406  

## 2017-07-15 NOTE — ED Triage Notes (Addendum)
Pt was bending down to pick something up several days, when he felt a sharp shooting pain to his lower back. Pt has neurofibromatosis and has a tumor to his right lower back. He is concerned that he might have a tumor growing where it is affecting a nerve. Ambulatory with steady gait. No loss of bladder or bowel. States he has having trouble lifting his legs.  Per FT PA, pt not appropriate for fast track.

## 2017-07-15 NOTE — Discharge Instructions (Signed)
Take prednisone as prescribed until all gone. After finish steroids, start taking ibuprofen or aleve for pain and inflammation. Take robaxin for muscle spasms. Percocet for severe pain. Follow up with family doctor Monday. Return if worsening symptoms.

## 2017-09-19 ENCOUNTER — Other Ambulatory Visit (HOSPITAL_COMMUNITY): Payer: Self-pay | Admitting: Neurosurgery

## 2017-09-19 DIAGNOSIS — C7230 Malignant neoplasm of unspecified optic nerve: Secondary | ICD-10-CM

## 2017-12-12 DIAGNOSIS — R82998 Other abnormal findings in urine: Secondary | ICD-10-CM | POA: Diagnosis not present

## 2017-12-12 DIAGNOSIS — Z0000 Encounter for general adult medical examination without abnormal findings: Secondary | ICD-10-CM | POA: Diagnosis not present

## 2017-12-12 DIAGNOSIS — E7849 Other hyperlipidemia: Secondary | ICD-10-CM | POA: Diagnosis not present

## 2017-12-19 DIAGNOSIS — Z1389 Encounter for screening for other disorder: Secondary | ICD-10-CM | POA: Diagnosis not present

## 2017-12-19 DIAGNOSIS — Q8501 Neurofibromatosis, type 1: Secondary | ICD-10-CM | POA: Diagnosis not present

## 2017-12-19 DIAGNOSIS — Z0000 Encounter for general adult medical examination without abnormal findings: Secondary | ICD-10-CM | POA: Diagnosis not present

## 2017-12-19 DIAGNOSIS — Z6831 Body mass index (BMI) 31.0-31.9, adult: Secondary | ICD-10-CM | POA: Diagnosis not present

## 2017-12-19 DIAGNOSIS — C7230 Malignant neoplasm of unspecified optic nerve: Secondary | ICD-10-CM | POA: Diagnosis not present

## 2017-12-19 DIAGNOSIS — E7849 Other hyperlipidemia: Secondary | ICD-10-CM | POA: Diagnosis not present

## 2017-12-19 DIAGNOSIS — M545 Low back pain: Secondary | ICD-10-CM | POA: Diagnosis not present

## 2018-06-06 IMAGING — MR MR CERVICAL SPINE WO/W CM
4 of 9 series · 18 of 48 positions shown · IV contrast (Yes)
Comparison: Brain MRI without and with contrast today reported
separately.

CLINICAL DATA: 25-year-old male with neurofibromatosis. Worsening
headaches and visual changes. Optic nerve glioma. Cervical neck
pain. Subsequent encounter.

EXAM:
MRI CERVICAL SPINE WITHOUT AND WITH CONTRAST
TECHNIQUE: Multiplanar and multiecho pulse sequences of the cervical spine, to
include the craniocervical junction and cervicothoracic junction,
were obtained according to standard protocol without and with
intravenous contrast.
CONTRAST:  18mL MULTIHANCE GADOBENATE DIMEGLUMINE 529 MG/ML IV SOLN
in conjunction with contrast enhanced imaging of the brain reported
separately.

[Series 2: T2 post-contrast · sagittal · 3.0mm · 0.41mm/px · 4 of 12 slices shown (1 of 2)]
[im 1/12]
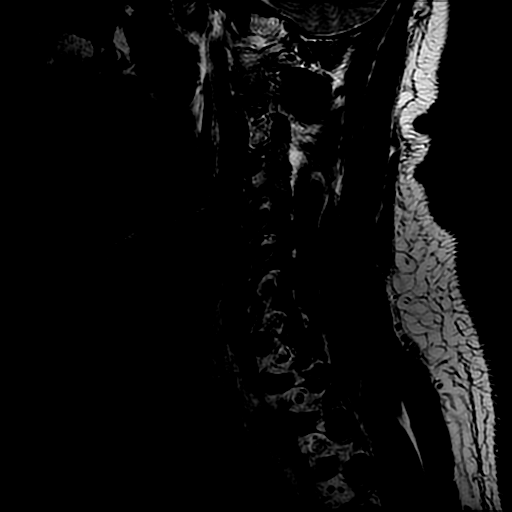
[im 4/12]
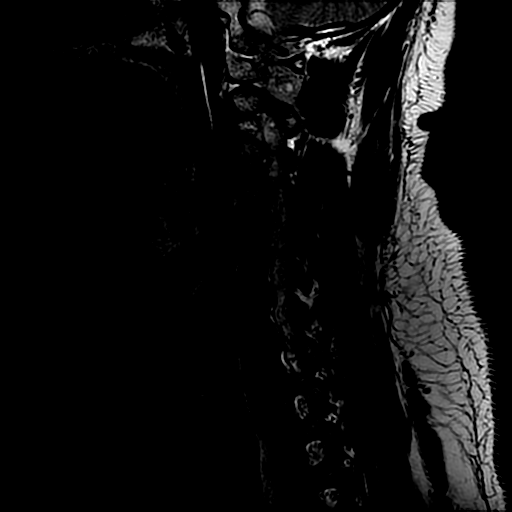
[im 8/12]
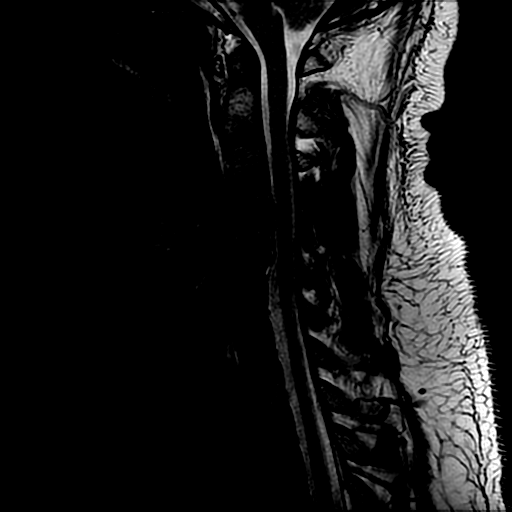
[im 12/12]
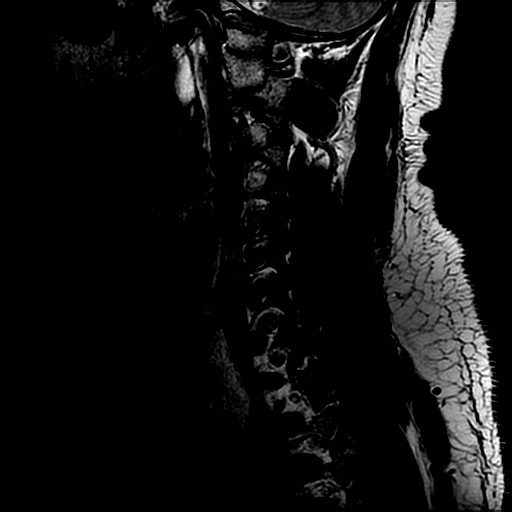

[Series 3: T2 post-contrast · sagittal · 3.0mm · 0.41mm/px · 4 of 12 slices shown (2 of 2)]
[im 1/12]
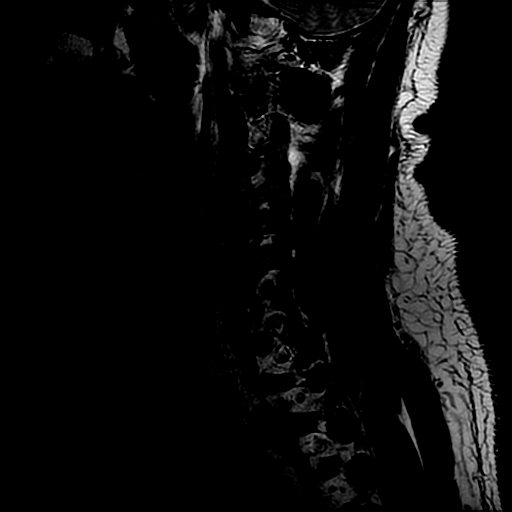
[im 4/12]
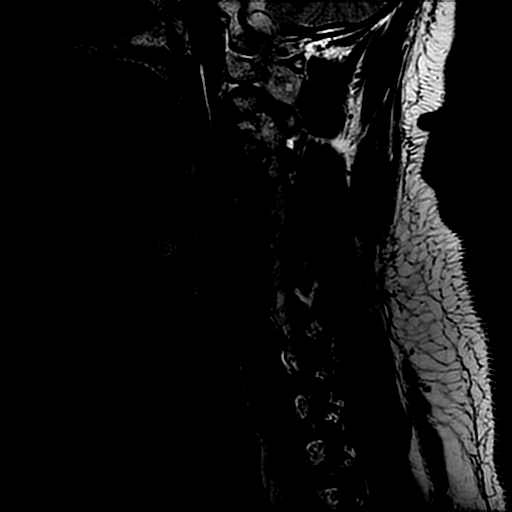
[im 8/12]
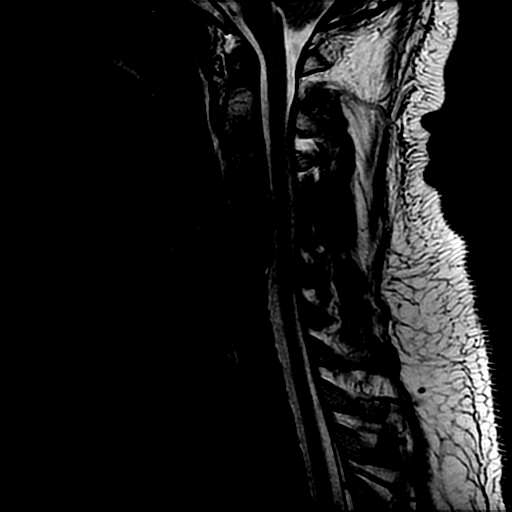
[im 12/12]
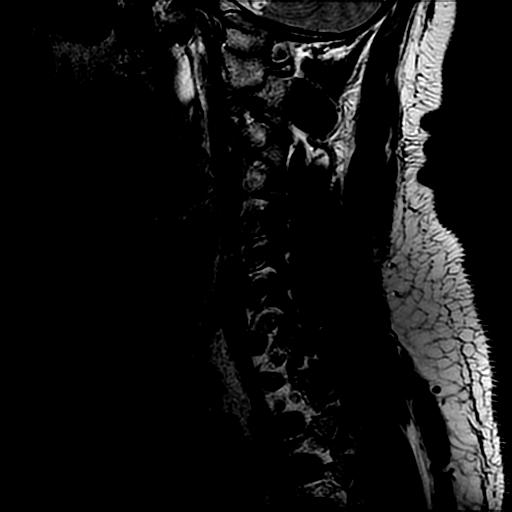

[Series 4: T2 · sagittal · 3.0mm · 0.41mm/px · 4 of 15 slices shown (1 of 2)]
[im 1/15]
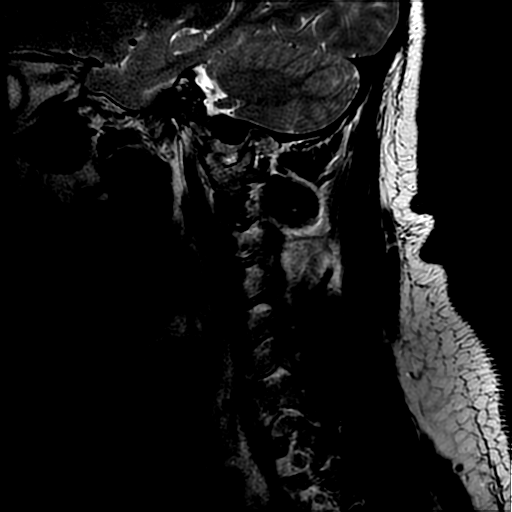
[im 5/15]
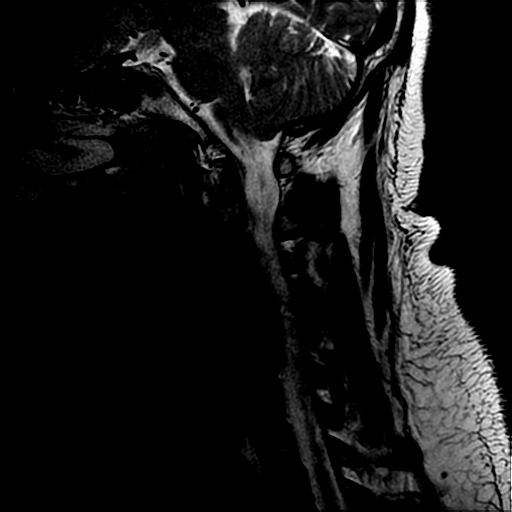
[im 10/15]
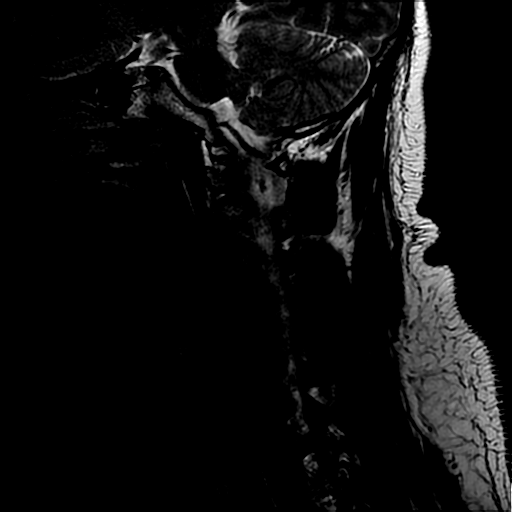
[im 15/15]
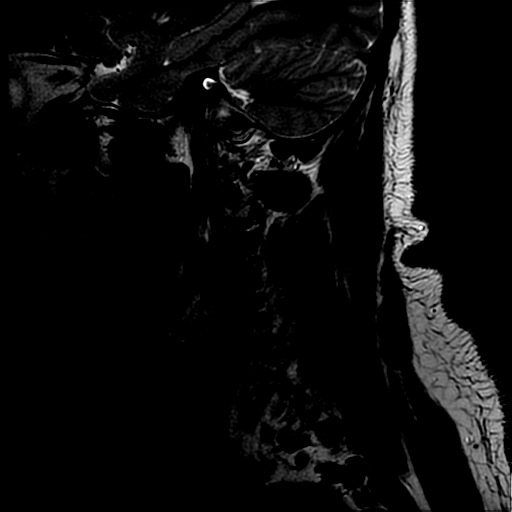

[Series 5: T2 · axial · 3.0mm · 0.35mm/px · z∈[-29,+53]mm · 6 of 29 slices shown (2 of 2)]
[im 1/29]
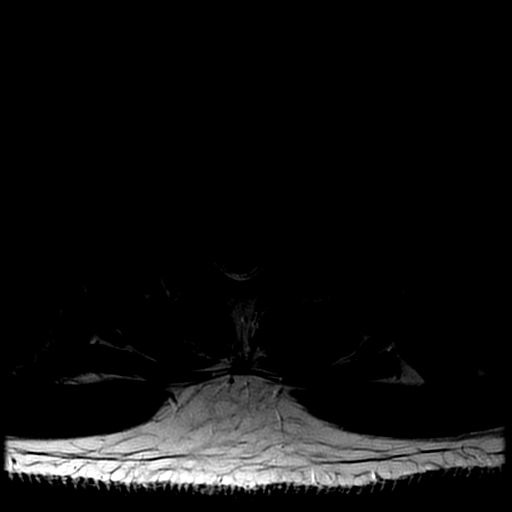
[im 5/29]
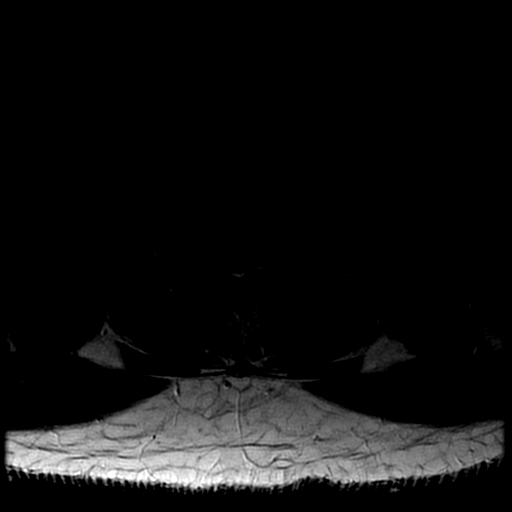
[im 9/29]
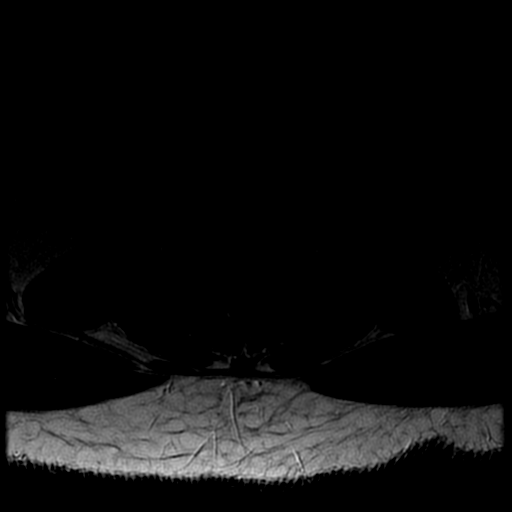
[im 13/29]
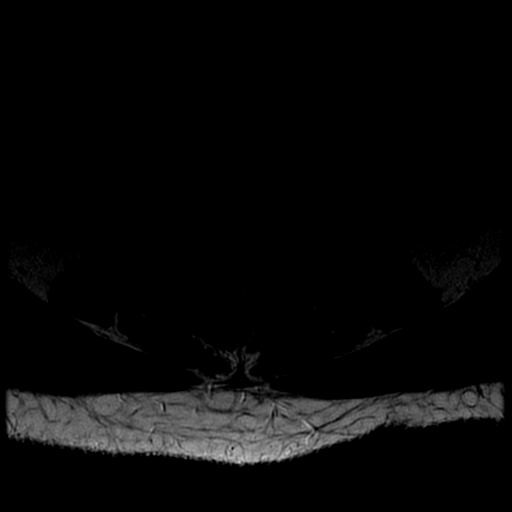
[im 17/29]
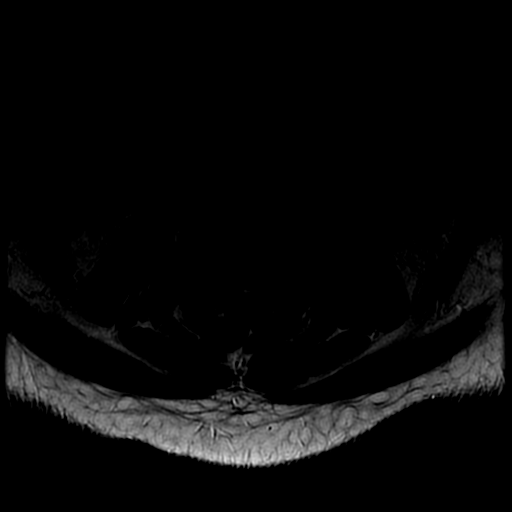
[im 25/29]
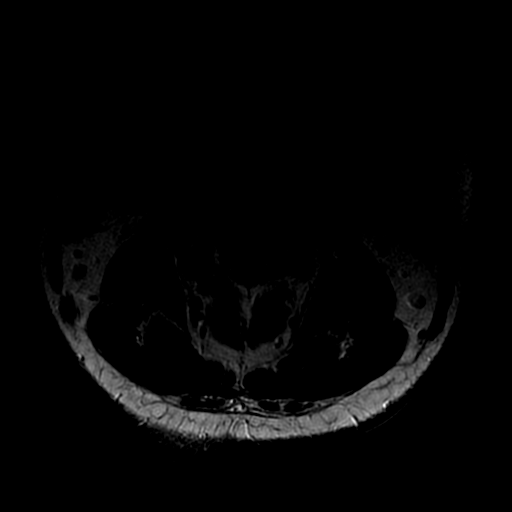

[18 of 48 positions shown; findings below may reference images not displayed]

FINDINGS: Straightening and mild reversal of cervical lordosis. No marrow
edema or evidence of acute osseous abnormality.

Cervicomedullary junction is within normal limits. Spinal cord
signal is within normal limits at all visualized levels. No abnormal
intradural enhancement.

There is a small nonspecific but homogeneously enhancing dermal
lesion in the left suboccipital soft tissues measuring 10 mm (series
4, image 11) which is likely a small neurofibroma. There are several
other similar superficial lesions (series 5, image 12 left posterior
lateral neck at the C4-C5 level).

There is no enlargement of the exiting cervical nerves. The visible
exiting upper thoracic nerves also appear normal.

The deep paraspinal soft tissues and visible deep soft tissue spaces
of the neck are within within normal limits.

The following superimposed degenerative changes are noted:

C2-C3:  Negative

C3-C4:  Negative

C4-C5: Mild disc space loss. Mild right eccentric circumferential
disc bulge. Borderline to mild spinal stenosis. No definite
foraminal stenosis.

C5-C6: mild circumferential disc bulge. Borderline to mild spinal
stenosis. No definite foraminal stenosis.

C6-C7:   Negative.

C7-T1:  Negative.

No upper thoracic spine degeneration.
IMPRESSION: 1. Scattered small superficial/dermal lesions probably are
neurofibromas in this setting. No cervical nerve sheath tumor,
plexiform neurofibroma, or deep soft tissue mass of the neck.
2. Cervical spine disc degeneration at C4-C5 and C5-C6 resulting in
mild spinal stenosis. No spinal cord mass effect or signal
abnormality.

## 2018-12-17 DIAGNOSIS — R82998 Other abnormal findings in urine: Secondary | ICD-10-CM | POA: Diagnosis not present

## 2018-12-17 DIAGNOSIS — E7849 Other hyperlipidemia: Secondary | ICD-10-CM | POA: Diagnosis not present

## 2018-12-17 DIAGNOSIS — Z0000 Encounter for general adult medical examination without abnormal findings: Secondary | ICD-10-CM | POA: Diagnosis not present

## 2018-12-24 DIAGNOSIS — Q8501 Neurofibromatosis, type 1: Secondary | ICD-10-CM | POA: Diagnosis not present

## 2018-12-24 DIAGNOSIS — E785 Hyperlipidemia, unspecified: Secondary | ICD-10-CM | POA: Diagnosis not present

## 2018-12-24 DIAGNOSIS — M722 Plantar fascial fibromatosis: Secondary | ICD-10-CM | POA: Diagnosis not present

## 2018-12-24 DIAGNOSIS — Z1339 Encounter for screening examination for other mental health and behavioral disorders: Secondary | ICD-10-CM | POA: Diagnosis not present

## 2018-12-24 DIAGNOSIS — G43909 Migraine, unspecified, not intractable, without status migrainosus: Secondary | ICD-10-CM | POA: Diagnosis not present

## 2018-12-24 DIAGNOSIS — Z0000 Encounter for general adult medical examination without abnormal findings: Secondary | ICD-10-CM | POA: Diagnosis not present

## 2018-12-24 DIAGNOSIS — M419 Scoliosis, unspecified: Secondary | ICD-10-CM | POA: Diagnosis not present

## 2018-12-24 DIAGNOSIS — Z1331 Encounter for screening for depression: Secondary | ICD-10-CM | POA: Diagnosis not present

## 2019-10-24 MED FILL — RIZATRIPTAN BENZOATE 10 MG: 10 | 30 days supply | Qty: 10 | Fill #0

## 2020-01-21 DIAGNOSIS — E7849 Other hyperlipidemia: Secondary | ICD-10-CM | POA: Diagnosis not present

## 2020-01-21 DIAGNOSIS — Z0000 Encounter for general adult medical examination without abnormal findings: Secondary | ICD-10-CM | POA: Diagnosis not present

## 2020-01-28 DIAGNOSIS — M419 Scoliosis, unspecified: Secondary | ICD-10-CM | POA: Diagnosis not present

## 2020-01-28 DIAGNOSIS — M19049 Primary osteoarthritis, unspecified hand: Secondary | ICD-10-CM | POA: Diagnosis not present

## 2020-01-28 DIAGNOSIS — E785 Hyperlipidemia, unspecified: Secondary | ICD-10-CM | POA: Diagnosis not present

## 2020-01-28 DIAGNOSIS — Z1331 Encounter for screening for depression: Secondary | ICD-10-CM | POA: Diagnosis not present

## 2020-01-28 DIAGNOSIS — M722 Plantar fascial fibromatosis: Secondary | ICD-10-CM | POA: Diagnosis not present

## 2020-01-28 DIAGNOSIS — R82998 Other abnormal findings in urine: Secondary | ICD-10-CM | POA: Diagnosis not present

## 2020-01-28 DIAGNOSIS — Q8501 Neurofibromatosis, type 1: Secondary | ICD-10-CM | POA: Diagnosis not present

## 2020-01-28 DIAGNOSIS — G43909 Migraine, unspecified, not intractable, without status migrainosus: Secondary | ICD-10-CM | POA: Diagnosis not present

## 2020-01-28 DIAGNOSIS — Z0000 Encounter for general adult medical examination without abnormal findings: Secondary | ICD-10-CM | POA: Diagnosis not present

## 2020-01-28 MED FILL — NAPROXEN 500 MG TABLET: 500 | 15 days supply | Qty: 30 | Fill #0

## 2021-02-24 ENCOUNTER — Other Ambulatory Visit (HOSPITAL_COMMUNITY): Payer: Self-pay

## 2021-02-24 DIAGNOSIS — M79601 Pain in right arm: Secondary | ICD-10-CM | POA: Diagnosis not present

## 2021-02-24 DIAGNOSIS — G589 Mononeuropathy, unspecified: Secondary | ICD-10-CM | POA: Diagnosis not present

## 2021-02-24 MED ORDER — PREDNISONE 10 MG (21) PO TBPK
ORAL_TABLET | ORAL | 0 refills | Status: DC
  Filled 2021-02-24: qty 21, 6d supply, fill #0

## 2021-02-24 MED ORDER — METHOCARBAMOL 500 MG PO TABS
500.0000 mg | ORAL_TABLET | Freq: Three times a day (TID) | ORAL | 0 refills | Status: DC | PRN
  Filled 2021-02-24: qty 90, 15d supply, fill #0

## 2021-05-25 ENCOUNTER — Encounter: Payer: Self-pay | Admitting: Family Medicine

## 2021-05-25 ENCOUNTER — Other Ambulatory Visit: Payer: Self-pay

## 2021-05-25 ENCOUNTER — Ambulatory Visit: Payer: 59 | Admitting: Family Medicine

## 2021-05-25 VITALS — BP 119/78 | HR 83 | Temp 98.5°F | Resp 16 | Ht 62.0 in | Wt 211.0 lb

## 2021-05-25 DIAGNOSIS — Z1159 Encounter for screening for other viral diseases: Secondary | ICD-10-CM | POA: Diagnosis not present

## 2021-05-25 DIAGNOSIS — Z114 Encounter for screening for human immunodeficiency virus [HIV]: Secondary | ICD-10-CM

## 2021-05-25 DIAGNOSIS — Z7689 Persons encountering health services in other specified circumstances: Secondary | ICD-10-CM

## 2021-05-25 DIAGNOSIS — Z0000 Encounter for general adult medical examination without abnormal findings: Secondary | ICD-10-CM

## 2021-05-25 NOTE — Progress Notes (Signed)
New Patient Office Visit  Subjective:  Patient ID: Gary Dorsey, male    DOB: Nov 01, 1990  Age: 30 y.o. MRN: [id number]  CC:  Chief Complaint  Patient presents with   Annual Exam    HPI Gary Dorsey presents for to establish care. Patient desires a routine annual physical exam and denies acute complaints or concerns.   Past Medical History:  Diagnosis Date   Back pain, chronic    Dyslexia    Hx of neurofibromatosis ([place])    Migraines    Scoliosis    Short-term memory loss     Past Surgical History:  Procedure Laterality Date   HERNIA REPAIR  1990   as a child 16 months old    Family History  Problem Relation Age of Onset   Arthritis Mother    Stroke Father    Diabetes Father    Heart attack Father    High blood pressure Father    Suicidality Sister    Colon cancer Unknown        great uncle   Migraines Neg Hx     Social History   Socioeconomic History   Marital status: Single    Spouse name: Not on file   Number of children: 0   Years of education: 12   Highest education level: Not on file  Occupational History   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  Tobacco Use   Smoking status: Never   Smokeless tobacco: Never  Substance and Sexual Activity   Alcohol use: No    Alcohol/week: 0.0 standard drinks   Drug use: No   Sexual activity: Not on file  Other Topics Concern   Not on file  Social History Narrative   Lives with mother   Caffeine use: Coffee at work sometimes   Soda rare    Social Determinants of Radio broadcast assistant Strain: Not on file  Food Insecurity: Not on file  Transportation Needs: Not on file  Physical Activity: Not on file  Stress: Not on file  Social Connections: Not on file  Intimate Partner Violence: Not on file    ROS Review of Systems  All other systems reviewed and are negative.  Objective:   Today's Vitals: BP 119/78   Pulse 83   Temp 98.5 F (36.9 C) (Oral)   Resp 16   Ht 5' 2"  (1.575 m)    Wt 211 lb (95.7 kg)   SpO2 94%   BMI 38.59 kg/m   Physical Exam Vitals and nursing note reviewed.  Constitutional:      General: He is not in acute distress. HENT:     Head: Normocephalic and atraumatic.     Right Ear: Tympanic membrane, ear canal and external ear normal.     Left Ear: Tympanic membrane, ear canal and external ear normal.     Nose: Nose normal.     Mouth/Throat:     Mouth: Mucous membranes are moist.     Pharynx: Oropharynx is clear.  Eyes:     Conjunctiva/sclera: Conjunctivae normal.     Pupils: Pupils are equal, round, and reactive to light.  Neck:     Thyroid: No thyromegaly.  Cardiovascular:     Rate and Rhythm: Normal rate and regular rhythm.     Heart sounds: Normal heart sounds. No murmur heard. Pulmonary:     Effort: Pulmonary effort is normal.     Breath sounds: Normal breath sounds.  Abdominal:     General: There is  no distension.     Palpations: Abdomen is soft. There is no mass.     Tenderness: There is no abdominal tenderness.     Hernia: There is no hernia in the left inguinal area or right inguinal area.  Genitourinary:    Penis: Normal and uncircumcised.      Testes: Normal.  Musculoskeletal:        General: Normal range of motion.     Cervical back: Normal range of motion and neck supple.     Right lower leg: No edema.     Left lower leg: No edema.  Skin:    General: Skin is warm and dry.     Comments: Cafe au lait spots noted diffusely; large benign tumors noted on back - right lumbar area nd smaller tumor near spine.   Neurological:     General: No focal deficit present.     Mental Status: He is alert and oriented to person, place, and time. Mental status is at baseline.  Psychiatric:        Mood and Affect: Mood normal.        Behavior: Behavior normal.    Assessment & Plan:   1. Visit for well man health check Routine labs ordered - CMP14+EGFR - CBC with Differential - Lipid Panel  2. Encounter for hepatitis C screening  test for low risk patient  - Hepatitis C Antibody  3. Screening for HIV (human immunodeficiency virus)  - HIV antibody (with reflex)  4. Encounter to establish care     Outpatient Encounter Medications as of 05/25/2021  Medication Sig   aspirin-acetaminophen-caffeine (EXCEDRIN MIGRAINE) 250-250-65 MG tablet Take 2 tablets by mouth every 6 (six) hours as needed for headache.   methocarbamol (ROBAXIN) 500 MG tablet Take 1-2 tablets (500-1,000 mg total) by mouth every 8 (eight) hours as needed for neck pain   methocarbamol (ROBAXIN) 750 MG tablet Take 1 tablet (750 mg total) by mouth 2 (two) times daily.   ondansetron (ZOFRAN) 4 MG tablet Take as needed for nausea during MRI   oxyCODONE-acetaminophen (PERCOCET) 5-325 MG tablet Take 1 tablet by mouth every 4 (four) hours as needed for severe pain.   predniSONE (STERAPRED UNI-PAK 21 TAB) 10 MG (21) TBPK tablet Take by mouth daily. Take 6 tabs by mouth daily  for 2 days, then 5 tabs for 2 days, then 4 tabs for 2 days, then 3 tabs for 2 days, 2 tabs for 2 days, then 1 tab by mouth daily for 2 days   predniSONE (STERAPRED UNI-PAK 21 TAB) 10 MG (21) TBPK tablet Follow enclosed patient instructions. Taper over 6 days   rizatriptan (MAXALT-MLT) 10 MG disintegrating tablet Take 1 tablet (10 mg total) by mouth as needed for migraine. May repeat in 2 hours if needed   No facility-administered encounter medications on file as of 05/25/2021.    Follow-up: Return in about 1 year (around 05/25/2022) for physical.   Becky Sax, MD

## 2021-05-26 LAB — CMP14+EGFR
ALT: 19 IU/L (ref 0–44)
AST: 19 IU/L (ref 0–40)
Albumin/Globulin Ratio: 1.8 (ref 1.2–2.2)
Albumin: 4.5 g/dL (ref 4.0–5.0)
Alkaline Phosphatase: 86 IU/L (ref 44–121)
BUN/Creatinine Ratio: 13 (ref 9–20)
BUN: 13 mg/dL (ref 6–20)
Bilirubin Total: <0.2 mg/dL (ref 0.0–1.2)
CO2: 21 mmol/L (ref 20–29)
Calcium: 9 mg/dL (ref 8.7–10.2)
Chloride: 102 mmol/L (ref 96–106)
Creatinine, Ser: 1.04 mg/dL (ref 0.76–1.27)
Globulin, Total: 2.5 g/dL (ref 1.5–4.5)
Glucose: 98 mg/dL (ref 70–99)
Potassium: 3.8 mmol/L (ref 3.5–5.2)
Sodium: 139 mmol/L (ref 134–144)
Total Protein: 7 g/dL (ref 6.0–8.5)
eGFR: 98 mL/min/{1.73_m2} (ref >59)

## 2021-05-26 LAB — CBC WITH DIFFERENTIAL/PLATELET
Basophils Absolute: 0.1 10*3/uL (ref 0.0–0.2)
Basos: 1 %
EOS (ABSOLUTE): 0.2 10*3/uL (ref 0.0–0.4)
Eos: 4 %
Hematocrit: 45.4 % (ref 37.5–51.0)
Hemoglobin: 14.9 g/dL (ref 13.0–17.7)
Immature Grans (Abs): 0 10*3/uL (ref 0.0–0.1)
Immature Granulocytes: 1 %
Lymphocytes Absolute: 1.4 10*3/uL (ref 0.7–3.1)
Lymphs: 23 %
MCH: 24.8 pg — ABNORMAL LOW (ref 26.6–33.0)
MCHC: 32.8 g/dL (ref 31.5–35.7)
MCV: 76 fL — ABNORMAL LOW (ref 79–97)
Monocytes Absolute: 0.6 10*3/uL (ref 0.1–0.9)
Monocytes: 9 %
Neutrophils Absolute: 3.9 10*3/uL (ref 1.4–7.0)
Neutrophils: 62 %
Platelets: 208 10*3/uL (ref 150–450)
RBC: 6.01 x10E6/uL — ABNORMAL HIGH (ref 4.14–5.80)
RDW: 16.6 % — ABNORMAL HIGH (ref 11.6–15.4)
WBC: 6.3 10*3/uL (ref 3.4–10.8)

## 2021-05-26 LAB — LIPID PANEL
Chol/HDL Ratio: 5.8 ratio — ABNORMAL HIGH (ref 0.0–5.0)
Cholesterol, Total: 181 mg/dL (ref 100–199)
HDL: 31 mg/dL — ABNORMAL LOW (ref >39)
LDL Chol Calc (NIH): 109 mg/dL — ABNORMAL HIGH (ref 0–99)
Triglycerides: 234 mg/dL — ABNORMAL HIGH (ref 0–149)
VLDL Cholesterol Cal: 41 mg/dL — ABNORMAL HIGH (ref 5–40)

## 2021-05-26 LAB — HIV ANTIBODY (ROUTINE TESTING W REFLEX): HIV Screen 4th Generation wRfx: NONREACTIVE

## 2021-05-26 LAB — HEPATITIS C ANTIBODY: Hep C Virus Ab: 0.1 s/co ratio (ref 0.0–0.9)

## 2022-06-07 ENCOUNTER — Ambulatory Visit (INDEPENDENT_AMBULATORY_CARE_PROVIDER_SITE_OTHER): Payer: 59 | Admitting: Family Medicine

## 2022-06-07 ENCOUNTER — Encounter: Payer: Self-pay | Admitting: Family Medicine

## 2022-06-07 VITALS — BP 119/79 | HR 69 | Temp 98.1°F | Resp 16 | Wt 211.6 lb

## 2022-06-07 DIAGNOSIS — Z0000 Encounter for general adult medical examination without abnormal findings: Secondary | ICD-10-CM | POA: Diagnosis not present

## 2022-06-07 DIAGNOSIS — Z13220 Encounter for screening for lipoid disorders: Secondary | ICD-10-CM

## 2022-06-07 DIAGNOSIS — Z130 Encounter for screening for diseases of the blood and blood-forming organs and certain disorders involving the immune mechanism: Secondary | ICD-10-CM

## 2022-06-07 DIAGNOSIS — D497 Neoplasm of unspecified behavior of endocrine glands and other parts of nervous system: Secondary | ICD-10-CM | POA: Insufficient documentation

## 2022-06-07 MED ORDER — MELOXICAM 15 MG PO TABS: 15.0000 mg | ORAL_TABLET | Freq: Every day | ORAL | 3 refills | Status: DC

## 2022-06-08 ENCOUNTER — Encounter: Payer: Self-pay | Admitting: Family Medicine

## 2022-06-08 LAB — CMP14+EGFR
ALT: 16 IU/L (ref 0–44)
AST: 15 IU/L (ref 0–40)
Albumin/Globulin Ratio: 1.7 (ref 1.2–2.2)
Albumin: 4.5 g/dL (ref 4.1–5.1)
Alkaline Phosphatase: 86 IU/L (ref 44–121)
BUN/Creatinine Ratio: 12 (ref 9–20)
BUN: 11 mg/dL (ref 6–20)
Bilirubin Total: 0.2 mg/dL (ref 0.0–1.2)
CO2: 19 mmol/L — ABNORMAL LOW (ref 20–29)
Calcium: 9.2 mg/dL (ref 8.7–10.2)
Chloride: 103 mmol/L (ref 96–106)
Creatinine, Ser: 0.89 mg/dL (ref 0.76–1.27)
Globulin, Total: 2.6 g/dL (ref 1.5–4.5)
Glucose: 88 mg/dL (ref 70–99)
Potassium: 3.8 mmol/L (ref 3.5–5.2)
Sodium: 140 mmol/L (ref 134–144)
Total Protein: 7.1 g/dL (ref 6.0–8.5)
eGFR: 116 mL/min/{1.73_m2} (ref >59)

## 2022-06-08 LAB — LIPID PANEL
Chol/HDL Ratio: 5.1 ratio — ABNORMAL HIGH (ref 0.0–5.0)
Cholesterol, Total: 185 mg/dL (ref 100–199)
HDL: 36 mg/dL — ABNORMAL LOW (ref >39)
LDL Chol Calc (NIH): 130 mg/dL — ABNORMAL HIGH (ref 0–99)
Triglycerides: 103 mg/dL (ref 0–149)
VLDL Cholesterol Cal: 19 mg/dL (ref 5–40)

## 2022-06-08 NOTE — Progress Notes (Signed)
Established Patient Office Visit  Subjective    Patient ID: Gary Dorsey, male    DOB: 02-May-1991  Age: 31 y.o. MRN: [id number]  CC:  Chief Complaint  Patient presents with   Annual Exam    HPI Gary Dorsey presents for routine annual exam. Patient denies acute complaints or concerns.    Outpatient Encounter Medications as of 06/07/2022  Medication Sig   meloxicam (MOBIC) 15 MG tablet Take 1 tablet (15 mg total) by mouth daily.   aspirin-acetaminophen-caffeine (EXCEDRIN MIGRAINE) 250-250-65 MG tablet Take 2 tablets by mouth every 6 (six) hours as needed for headache. (Patient not taking: Reported on 06/07/2022)   methocarbamol (ROBAXIN) 500 MG tablet Take 1-2 tablets (500-1,000 mg total) by mouth every 8 (eight) hours as needed for neck pain (Patient not taking: Reported on 06/07/2022)   methocarbamol (ROBAXIN) 750 MG tablet Take 1 tablet (750 mg total) by mouth 2 (two) times daily. (Patient not taking: Reported on 06/07/2022)   ondansetron (ZOFRAN) 4 MG tablet Take as needed for nausea during MRI (Patient not taking: Reported on 06/07/2022)   oxyCODONE-acetaminophen (PERCOCET) 5-325 MG tablet Take 1 tablet by mouth every 4 (four) hours as needed for severe pain. (Patient not taking: Reported on 06/07/2022)   predniSONE (STERAPRED UNI-PAK 21 TAB) 10 MG (21) TBPK tablet Take by mouth daily. Take 6 tabs by mouth daily  for 2 days, then 5 tabs for 2 days, then 4 tabs for 2 days, then 3 tabs for 2 days, 2 tabs for 2 days, then 1 tab by mouth daily for 2 days (Patient not taking: Reported on 06/07/2022)   predniSONE (STERAPRED UNI-PAK 21 TAB) 10 MG (21) TBPK tablet Follow enclosed patient instructions. Taper over 6 days (Patient not taking: Reported on 06/07/2022)   rizatriptan (MAXALT-MLT) 10 MG disintegrating tablet Take 1 tablet (10 mg total) by mouth as needed for migraine. May repeat in 2 hours if needed (Patient not taking: Reported on 06/07/2022)   No facility-administered encounter medications  on file as of 06/07/2022.    Past Medical History:  Diagnosis Date   Back pain, chronic    Dyslexia    Hx of neurofibromatosis ([place])    Migraines    Scoliosis    Short-term memory loss     Past Surgical History:  Procedure Laterality Date   HERNIA REPAIR  1990   as a child 21 months old    Family History  Problem Relation Age of Onset   Arthritis Mother    Stroke Father    Diabetes Father    Heart attack Father    High blood pressure Father    Suicidality Sister    Colon cancer Unknown        great uncle   Migraines Neg Hx     Social History   Socioeconomic History   Marital status: Single    Spouse name: Not on file   Number of children: 0   Years of education: 12   Highest education level: Not on file  Occupational History   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  Tobacco Use   Smoking status: Never   Smokeless tobacco: Never  Substance and Sexual Activity   Alcohol use: No    Alcohol/week: 0.0 standard drinks of alcohol   Drug use: No   Sexual activity: Not on file  Other Topics Concern   Not on file  Social History Narrative   Lives with mother   Caffeine use: Coffee at work sometimes  Soda rare    Social Determinants of Radio broadcast assistant Strain: Not on file  Food Insecurity: Not on file  Transportation Needs: Not on file  Physical Activity: Not on file  Stress: Not on file  Social Connections: Not on file  Intimate Partner Violence: Not on file    Review of Systems  All other systems reviewed and are negative.       Objective    BP 119/79   Pulse 69   Temp 98.1 F (36.7 C) (Oral)   Resp 16   Wt 211 lb 9.6 oz (96 kg)   SpO2 97%   BMI 38.70 kg/m   Physical Exam Vitals and nursing note reviewed.  Constitutional:      General: He is not in acute distress. HENT:     Head: Normocephalic and atraumatic.     Right Ear: Tympanic membrane, ear canal and external ear normal.     Left Ear: Tympanic membrane, ear  canal and external ear normal.     Nose: Nose normal.     Mouth/Throat:     Mouth: Mucous membranes are moist.     Pharynx: Oropharynx is clear.  Eyes:     Conjunctiva/sclera: Conjunctivae normal.     Pupils: Pupils are equal, round, and reactive to light.  Neck:     Thyroid: No thyromegaly.  Cardiovascular:     Rate and Rhythm: Normal rate and regular rhythm.     Heart sounds: Normal heart sounds. No murmur heard. Pulmonary:     Effort: Pulmonary effort is normal.     Breath sounds: Normal breath sounds.  Abdominal:     General: There is no distension.     Palpations: Abdomen is soft. There is no mass.     Tenderness: There is no abdominal tenderness.     Hernia: There is no hernia in the left inguinal area or right inguinal area.  Genitourinary:    Penis: Normal and uncircumcised.      Testes: Normal.  Musculoskeletal:        General: Normal range of motion.     Cervical back: Normal range of motion and neck supple.     Right lower leg: No edema.     Left lower leg: No edema.  Skin:    General: Skin is warm and dry.     Comments: Cafe au lait spots noted diffusely; large benign tumors noted on back - right lumbar area and smaller tumor near spine.   Neurological:     General: No focal deficit present.     Mental Status: He is alert and oriented to person, place, and time. Mental status is at baseline.  Psychiatric:        Mood and Affect: Mood normal.        Behavior: Behavior normal.         Assessment & Plan:   1. Visit for well man health check  - CMP14+EGFR  2. Screening for deficiency anemia   3. Screening for lipid disorders  - Lipid Panel    Return in about 1 year (around 06/08/2023) for physical.   Becky Sax, MD

## 2022-06-10 ENCOUNTER — Other Ambulatory Visit (HOSPITAL_COMMUNITY): Payer: Self-pay

## 2022-07-14 ENCOUNTER — Telehealth: Payer: Self-pay | Admitting: *Deleted

## 2022-07-14 NOTE — Telephone Encounter (Signed)
Patient left message below.  Calling patient back to gain clarity. Patient had wellness exam 06/25/22.    Appointment Request From: Daryel November   With Provider: Becky Sax, MD [facility] [place]   Preferred Date Range: 06/08/2023 - 06/08/2023   Preferred Times: Any Time   Reason for visit: Office Visit   Comments: physical

## 2023-06-08 ENCOUNTER — Encounter: Payer: Self-pay | Admitting: Family Medicine

## 2023-06-08 ENCOUNTER — Ambulatory Visit (INDEPENDENT_AMBULATORY_CARE_PROVIDER_SITE_OTHER): Payer: 59 | Admitting: Family Medicine

## 2023-06-08 VITALS — BP 122/79 | HR 78 | Temp 98.5°F | Resp 16 | Ht 67.31 in | Wt 220.4 lb

## 2023-06-08 DIAGNOSIS — Z13220 Encounter for screening for lipoid disorders: Secondary | ICD-10-CM | POA: Diagnosis not present

## 2023-06-08 DIAGNOSIS — Z130 Encounter for screening for diseases of the blood and blood-forming organs and certain disorders involving the immune mechanism: Secondary | ICD-10-CM

## 2023-06-08 DIAGNOSIS — Z0000 Encounter for general adult medical examination without abnormal findings: Secondary | ICD-10-CM

## 2023-06-09 LAB — LIPID PANEL
Chol/HDL Ratio: 5.3 {ratio} — ABNORMAL HIGH (ref 0.0–5.0)
Cholesterol, Total: 189 mg/dL (ref 100–199)
HDL: 36 mg/dL — ABNORMAL LOW (ref >39)
LDL Chol Calc (NIH): 128 mg/dL — ABNORMAL HIGH (ref 0–99)
Triglycerides: 136 mg/dL (ref 0–149)
VLDL Cholesterol Cal: 25 mg/dL (ref 5–40)

## 2023-06-09 LAB — CBC WITH DIFFERENTIAL/PLATELET
Basophils Absolute: 0.1 10*3/uL (ref 0.0–0.2)
Basos: 1 %
EOS (ABSOLUTE): 0.1 10*3/uL (ref 0.0–0.4)
Eos: 2 %
Hematocrit: 47.7 % (ref 37.5–51.0)
Hemoglobin: 14.9 g/dL (ref 13.0–17.7)
Immature Grans (Abs): 0 10*3/uL (ref 0.0–0.1)
Immature Granulocytes: 0 %
Lymphocytes Absolute: 1.2 10*3/uL (ref 0.7–3.1)
Lymphs: 22 %
MCH: 24.2 pg — ABNORMAL LOW (ref 26.6–33.0)
MCHC: 31.2 g/dL — ABNORMAL LOW (ref 31.5–35.7)
MCV: 78 fL — ABNORMAL LOW (ref 79–97)
Monocytes Absolute: 0.4 10*3/uL (ref 0.1–0.9)
Monocytes: 7 %
Neutrophils Absolute: 3.5 10*3/uL (ref 1.4–7.0)
Neutrophils: 68 %
Platelets: 240 10*3/uL (ref 150–450)
RBC: 6.15 x10E6/uL — ABNORMAL HIGH (ref 4.14–5.80)
RDW: 17.5 % — ABNORMAL HIGH (ref 11.6–15.4)
WBC: 5.3 10*3/uL (ref 3.4–10.8)

## 2023-06-09 LAB — CMP14+EGFR
ALT: 25 [IU]/L (ref 0–44)
AST: 16 [IU]/L (ref 0–40)
Albumin: 4.5 g/dL (ref 4.1–5.1)
Alkaline Phosphatase: 95 [IU]/L (ref 44–121)
BUN/Creatinine Ratio: 12 (ref 9–20)
BUN: 11 mg/dL (ref 6–20)
Bilirubin Total: 0.3 mg/dL (ref 0.0–1.2)
CO2: 21 mmol/L (ref 20–29)
Calcium: 9.1 mg/dL (ref 8.7–10.2)
Chloride: 102 mmol/L (ref 96–106)
Creatinine, Ser: 0.93 mg/dL (ref 0.76–1.27)
Globulin, Total: 2.4 g/dL (ref 1.5–4.5)
Glucose: 112 mg/dL — ABNORMAL HIGH (ref 70–99)
Potassium: 3.9 mmol/L (ref 3.5–5.2)
Sodium: 140 mmol/L (ref 134–144)
Total Protein: 6.9 g/dL (ref 6.0–8.5)
eGFR: 111 mL/min/{1.73_m2} (ref >59)

## 2023-06-12 ENCOUNTER — Encounter: Payer: Self-pay | Admitting: Family Medicine

## 2023-06-12 NOTE — Progress Notes (Signed)
Established Patient Office Visit  Subjective    Patient ID: Gary Dorsey, male    DOB: 01-Jul-1991  Age: 32 y.o. MRN: [id number]  CC:  Chief Complaint  Patient presents with   Annual Exam    HPI Manbir Loschiavo presents for routine annual exam. He reports no significant changes in his health in the interim since his last physical. He denies acute complaints or concerns.   Outpatient Encounter Medications as of 06/08/2023  Medication Sig   aspirin-acetaminophen-caffeine (EXCEDRIN MIGRAINE) 250-250-65 MG tablet Take 2 tablets by mouth every 6 (six) hours as needed for headache. (Patient not taking: Reported on 06/07/2022)   meloxicam (MOBIC) 15 MG tablet Take 1 tablet (15 mg total) by mouth daily. (Patient not taking: Reported on 06/08/2023)   methocarbamol (ROBAXIN) 500 MG tablet Take 1-2 tablets (500-1,000 mg total) by mouth every 8 (eight) hours as needed for neck pain (Patient not taking: Reported on 06/07/2022)   methocarbamol (ROBAXIN) 750 MG tablet Take 1 tablet (750 mg total) by mouth 2 (two) times daily. (Patient not taking: Reported on 06/07/2022)   ondansetron (ZOFRAN) 4 MG tablet Take as needed for nausea during MRI (Patient not taking: Reported on 06/07/2022)   oxyCODONE-acetaminophen (PERCOCET) 5-325 MG tablet Take 1 tablet by mouth every 4 (four) hours as needed for severe pain. (Patient not taking: Reported on 06/07/2022)   predniSONE (STERAPRED UNI-PAK 21 TAB) 10 MG (21) TBPK tablet Take by mouth daily. Take 6 tabs by mouth daily  for 2 days, then 5 tabs for 2 days, then 4 tabs for 2 days, then 3 tabs for 2 days, 2 tabs for 2 days, then 1 tab by mouth daily for 2 days (Patient not taking: Reported on 06/07/2022)   predniSONE (STERAPRED UNI-PAK 21 TAB) 10 MG (21) TBPK tablet Follow enclosed patient instructions. Taper over 6 days (Patient not taking: Reported on 06/07/2022)   rizatriptan (MAXALT-MLT) 10 MG disintegrating tablet Take 1 tablet (10 mg total) by mouth as needed for migraine.  May repeat in 2 hours if needed (Patient not taking: Reported on 06/07/2022)   No facility-administered encounter medications on file as of 06/08/2023.    Past Medical History:  Diagnosis Date   Back pain, chronic    Dyslexia    Hx of neurofibromatosis ([facility])    Migraines    Scoliosis    Short-term memory loss     Past Surgical History:  Procedure Laterality Date   HERNIA REPAIR  1990   as a child 10 months old    Family History  Problem Relation Age of Onset   Arthritis Mother    Stroke Father    Diabetes Father    Heart attack Father    High blood pressure Father    Suicidality Sister    Colon cancer Unknown        great uncle   Migraines Neg Hx     Social History   Socioeconomic History   Marital status: Single    Spouse name: Not on file   Number of children: 0   Years of education: 12   Highest education level: Not on file  Occupational History   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  Tobacco Use   Smoking status: Never   Smokeless tobacco: Never  Substance and Sexual Activity   Alcohol use: No    Alcohol/week: 0.0 standard drinks of alcohol   Drug use: No   Sexual activity: Not on file  Other Topics Concern   Not on  file  Social History Narrative   Lives with mother   Caffeine use: Coffee at work sometimes   Soda rare    Social Determinants of Health   Financial Resource Strain: Low Risk  (06/08/2023)   Overall Financial Resource Strain (CARDIA)    Difficulty of Paying Living Expenses: Not hard at all  Food Insecurity: No Food Insecurity (06/08/2023)   Hunger Vital Sign    Worried About Running Out of Food in the Last Year: Never true    Ran Out of Food in the Last Year: Never true  Transportation Needs: No Transportation Needs (06/08/2023)   PRAPARE - Administrator, Civil Service (Medical): No    Lack of Transportation (Non-Medical): No  Physical Activity: Sufficiently Active (06/08/2023)   Exercise Vital Sign    Days of  Exercise per Week: 5 days    Minutes of Exercise per Session: 30 min  Stress: No Stress Concern Present (06/08/2023)   [employer] of Occupational Health - Occupational Stress Questionnaire    Feeling of Stress : Not at all  Social Connections: Socially Isolated (06/08/2023)   Social Connection and Isolation Panel [NHANES]    Frequency of Communication with Friends and Family: More than three times a week    Frequency of Social Gatherings with Friends and Family: More than three times a week    Attends Religious Services: Never    Database administrator or Organizations: No    Attends Banker Meetings: Never    Marital Status: Never married  Intimate Partner Violence: Not At Risk (06/08/2023)   Humiliation, Afraid, Rape, and Kick questionnaire    Fear of Current or Ex-Partner: No    Emotionally Abused: No    Physically Abused: No    Sexually Abused: No    Review of Systems  All other systems reviewed and are negative.       Objective    BP 122/79 (BP Location: Right Arm, Patient Position: Sitting, Cuff Size: Normal)   Pulse 78   Temp 98.5 F (36.9 C) (Oral)   Resp 16   Ht 5' 7.31" (1.71 m)   Wt 220 lb 6.4 oz (100 kg)   SpO2 93%   BMI 34.21 kg/m   Physical Exam Vitals and nursing note reviewed.  Constitutional:      General: He is not in acute distress. HENT:     Head: Normocephalic and atraumatic.     Right Ear: Tympanic membrane, ear canal and external ear normal.     Left Ear: Tympanic membrane, ear canal and external ear normal.     Nose: Nose normal.     Mouth/Throat:     Mouth: Mucous membranes are moist.     Pharynx: Oropharynx is clear.  Eyes:     Conjunctiva/sclera: Conjunctivae normal.     Pupils: Pupils are equal, round, and reactive to light.  Neck:     Thyroid: No thyromegaly.  Cardiovascular:     Rate and Rhythm: Normal rate and regular rhythm.     Heart sounds: Normal heart sounds. No murmur heard. Pulmonary:     Effort:  Pulmonary effort is normal.     Breath sounds: Normal breath sounds.  Abdominal:     General: There is no distension.     Palpations: Abdomen is soft. There is no mass.     Tenderness: There is no abdominal tenderness.     Hernia: There is no hernia in the left inguinal area or right  inguinal area.  Genitourinary:    Penis: Normal and uncircumcised.      Testes: Normal.  Musculoskeletal:        General: Normal range of motion.     Cervical back: Normal range of motion and neck supple.     Right lower leg: No edema.     Left lower leg: No edema.  Skin:    General: Skin is warm and dry.     Comments: Cafe au lait spots noted diffusely; large benign tumors noted on back - right lumbar area and smaller tumor near spine.   Neurological:     General: No focal deficit present.     Mental Status: He is alert and oriented to person, place, and time. Mental status is at baseline.  Psychiatric:        Mood and Affect: Mood normal.        Behavior: Behavior normal.         Assessment & Plan:   Annual physical exam -     CMP14+EGFR  Screening for deficiency anemia -     CBC with Differential/Platelet  Screening for lipid disorders -     Lipid panel     Return in about 1 year (around 06/07/2024) for physical.   Tommie Raymond, MD

## 2023-06-15 ENCOUNTER — Ambulatory Visit
Admission: EM | Admit: 2023-06-15 | Discharge: 2023-06-15 | Disposition: A | Discharge status: Home / Self Care | Payer: 59

## 2023-06-15 DIAGNOSIS — J069 Acute upper respiratory infection, unspecified: Secondary | ICD-10-CM | POA: Diagnosis not present

## 2023-06-15 LAB — POC COVID19/FLU A&B COMBO
Covid Antigen, POC: NEGATIVE
Influenza A Antigen, POC: NEGATIVE
Influenza B Antigen, POC: NEGATIVE

## 2023-06-15 LAB — POCT RAPID STREP A (OFFICE): Rapid Strep A Screen: NEGATIVE

## 2023-06-15 NOTE — ED Triage Notes (Signed)
"  This started on Sunday with nose real dry and sinus problems, progressed to sore throat, cough (productive), no fever known". "I feel hot/cold a lot". No new/unexplained rash. Flu vaccine in Oct. 2024. No COVID19 vaccines.

## 2023-06-15 NOTE — ED Provider Notes (Signed)
[facility]    CSN: [id number] Arrival date & time: 06/15/23  6433      History   Chief Complaint Chief Complaint  Patient presents with   Sore Throat   Cough   Ear Problem    HPI Gary Dorsey is a 32 y.o. male.   Patient here today for evaluation of sinus congestion and dryness, sore throat, cough that started a few days ago.  He has not had any vomiting.  He denies any rashes.  He states he does feel hot cold a lot and is unsure of fever.  He has taken over-the-counter medication without resolution.  The history is provided by the patient.  Sore Throat Pertinent negatives include no abdominal pain and no shortness of breath.  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 Authorizing Provider  Acetaminophen-guaiFENesin [facility] COLD & FLU PO) Take by mouth.   Yes [provider]  cyclobenzaprine (FLEXERIL) 5 MG tablet Take 5 mg by mouth at bedtime. 10/02/12  Yes [provider]  predniSONE (DELTASONE) 20 MG tablet Take 2 tablets (40 mg total) by mouth daily with breakfast for 5 days. 06/19/23 06/24/23  Tomi Bamberger, PA-C  Pseudoephedrine-APAP-DM (DAYQUIL PO) Take by mouth.    [provider]    Family History Family History  Problem Relation Age of Onset   Arthritis Mother    Stroke Father    Diabetes Father    Heart attack Father    High blood pressure Father    Suicidality Sister    Colon cancer Other        great uncle   Migraines Neg Hx     Social History Social History   Tobacco Use   Smoking status: Never   Smokeless tobacco: Never  Vaping Use   Vaping status: Never Used  Substance Use Topics   Alcohol use: Not Currently    Alcohol/week: 2.0 standard drinks of alcohol    Types: 2 Cans of beer per week    Comment: occassionally.   Drug use: Never     Allergies   Iodinated contrast media   Review of Systems Review of Systems  Constitutional:  Positive for chills. Negative for fever.  HENT:  Positive for congestion and sore throat. Negative for ear pain.   Eyes:  Negative for discharge and redness.  Respiratory:  Positive for cough. Negative for shortness of breath.   Gastrointestinal:  Negative for abdominal pain, nausea and vomiting.     Physical Exam Triage Vital  Signs ED Triage Vitals  Encounter Vitals Group     BP      Systolic BP Percentile      Diastolic BP Percentile      Pulse      Resp      Temp      Temp src      SpO2      Weight      Height      Head Circumference      Peak Flow      Pain Score      Pain Loc      Pain Education      Exclude from Growth Chart    No data found.  Updated Vital Signs BP 107/72 (BP Location: Left Arm)   Pulse (!) 102   Temp 98.6 F (37 C) (Oral)   Resp 20   Ht 5\' 8"  (1.727 m)   Wt 220 lb 7.4 oz (100 kg)   SpO2 97%   BMI 33.52 kg/m       Physical Exam Vitals and nursing note reviewed.  Constitutional:      General: He is not in acute distress.    Appearance: Normal appearance. He is not ill-appearing.  HENT:     Head: Normocephalic and atraumatic.     Right Ear: Tympanic  membrane normal.     Left Ear: Tympanic membrane normal.     Nose: Congestion present.     Mouth/Throat:     Mouth: Mucous membranes are moist.     Pharynx: Oropharynx is clear. No oropharyngeal exudate or posterior oropharyngeal erythema.  Eyes:     Conjunctiva/sclera: Conjunctivae normal.  Cardiovascular:     Rate and Rhythm: Normal rate and regular rhythm.     Heart sounds: Normal heart sounds. No murmur heard. Pulmonary:     Effort: Pulmonary effort is normal. No respiratory distress.     Breath sounds: Normal breath sounds. No wheezing, rhonchi or rales.  Skin:    General: Skin is warm and dry.  Neurological:     Mental Status: He is alert.  Psychiatric:        Mood and Affect: Mood normal.        Thought Content: Thought content normal.      UC Treatments / Results  Labs (all labs ordered are listed, but only abnormal results are displayed) Labs Reviewed  POC COVID19/FLU A&B COMBO - Normal  POCT RAPID STREP A (OFFICE) - Normal    EKG    Procedures Procedures (including critical care time)  Medications Ordered in UC Medications - No data to display  Initial Impression / Assessment and Plan / UC Course  I have reviewed the triage vital signs and the nursing notes.  Pertinent labs & imaging results that were available during my care of the patient were reviewed by me and considered in my medical decision making (see chart for details).    Point-of-care screening negative.  Suspect viral etiology of symptoms.  Recommended symptomatic treatment, increase fluids and rest with follow-up if no gradual improvement with any further concerns.  Final Clinical Impressions(s) / UC Diagnoses   Final diagnoses:  Viral upper respiratory tract infection   Discharge Instructions   None    ED Prescriptions   None    PDMP not reviewed this encounter.   Tomi Bamberger, PA-C 06/20/23 [phone]

## 2023-06-19 ENCOUNTER — Ambulatory Visit (INDEPENDENT_AMBULATORY_CARE_PROVIDER_SITE_OTHER): Payer: 59

## 2023-06-19 ENCOUNTER — Ambulatory Visit
Admission: EM | Admit: 2023-06-19 | Discharge: 2023-06-19 | Disposition: A | Discharge status: Home / Self Care | Payer: 59 | Attending: Physician Assistant | Admitting: Physician Assistant

## 2023-06-19 DIAGNOSIS — J209 Acute bronchitis, unspecified: Secondary | ICD-10-CM | POA: Diagnosis not present

## 2023-06-19 DIAGNOSIS — R509 Fever, unspecified: Secondary | ICD-10-CM | POA: Diagnosis not present

## 2023-06-19 DIAGNOSIS — R059 Cough, unspecified: Secondary | ICD-10-CM | POA: Diagnosis not present

## 2023-06-19 DIAGNOSIS — R042 Hemoptysis: Secondary | ICD-10-CM | POA: Diagnosis not present

## 2023-06-19 MED ORDER — PREDNISONE 20 MG PO TABS: 40.0000 mg | ORAL_TABLET | Freq: Every day | ORAL | 0 refills | Status: AC

## 2023-06-19 NOTE — ED Provider Notes (Signed)
[facility]    CSN: [id number] Arrival date & time: 06/19/23  0810      History   Chief Complaint Chief Complaint  Patient presents with   Cough    HPI Gary Dorsey is a 32 y.o. male.   Patient here today for evaluation of cough that has continued for over a week.  He reports that he has a low-grade fever at night with questionable shortness of breath.  He denies any other symptoms.  He has been taken over-the-counter medication without resolution.  He was seen recently and had screening for COVID and flu which was negative.  The history is provided by the patient.  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 Authorizing Provider  predniSONE (DELTASONE) 20 MG tablet Take 2 tablets (40 mg total) by mouth daily with breakfast for 5 days. 06/19/23 06/24/23 Yes Tomi Bamberger, PA-C  Pseudoephedrine-APAP-DM (DAYQUIL PO) Take by mouth.   Yes [provider]  Acetaminophen-guaiFENesin [facility] COLD & FLU PO) Take by mouth.    [provider]  cyclobenzaprine (FLEXERIL) 5 MG tablet Take 5 mg by mouth at  bedtime. 10/02/12   [provider]    Family History Family History  Problem Relation Age of Onset   Arthritis Mother    Stroke Father    Diabetes Father    Heart attack Father    High blood pressure Father    Suicidality Sister    Colon cancer Other        great uncle   Migraines Neg Hx     Social History Social History   Tobacco Use   Smoking status: Never   Smokeless tobacco: Never  Vaping Use   Vaping status: Never Used  Substance Use Topics   Alcohol use: Not Currently    Alcohol/week: 2.0 standard drinks of alcohol    Types: 2 Cans of beer per week    Comment: occassionally.   Drug use: Never     Allergies   Iodinated contrast media   Review of Systems Review of Systems  Constitutional:  Positive for fever (subjective- low grade).  HENT:  Positive for congestion. Negative for ear pain and sore throat.   Eyes:  Negative for discharge and redness.  Respiratory:  Positive for cough. Negative for shortness of breath.   Gastrointestinal:  Negative for abdominal pain, nausea and vomiting.     Physical Exam Triage Vital Signs ED Triage Vitals  Encounter Vitals Group     BP 06/19/23 7829  116/78     Systolic BP Percentile --      Diastolic BP Percentile --      Pulse Rate 06/19/23 0922 71     Resp 06/19/23 0922 20     Temp 06/19/23 0922 98.6 F (37 C)     Temp Source 06/19/23 0922 Oral     SpO2 06/19/23 0922 95 %     Weight 06/19/23 0920 200 lb (90.7 kg)     Height 06/19/23 0920 5\' 8"  (1.727 m)     Head Circumference --      Peak Flow --      Pain Score 06/19/23 0918 0     Pain Loc --      Pain Education --      Exclude from Growth Chart --    No data found.  Updated Vital Signs BP 116/78 (BP Location: Left Arm)   Pulse 71   Temp 98.6 F (37 C) (Oral)   Resp 20   Ht 5\' 8"  (1.727 m)   Wt 200 lb (90.7 kg)   SpO2 95%   BMI 30.41 kg/m   Visual Acuity Right Eye Distance:   Left Eye Distance:   Bilateral Distance:    Right Eye  Near:   Left Eye Near:    Bilateral Near:     Physical Exam Vitals and nursing note reviewed.  Constitutional:      General: He is not in acute distress.    Appearance: Normal appearance. He is not ill-appearing.  HENT:     Head: Normocephalic and atraumatic.     Nose: Nose normal. No congestion.     Mouth/Throat:     Mouth: Mucous membranes are moist.     Pharynx: Oropharynx is clear. No oropharyngeal exudate or posterior oropharyngeal erythema.  Eyes:     Conjunctiva/sclera: Conjunctivae normal.  Cardiovascular:     Rate and Rhythm: Normal rate and regular rhythm.     Heart sounds: Normal heart sounds. No murmur heard. Pulmonary:     Effort: Pulmonary effort is normal. No respiratory distress.     Breath sounds: Normal breath sounds. No wheezing, rhonchi or rales.  Skin:    General: Skin is warm and dry.  Neurological:     Mental Status: He is alert.  Psychiatric:        Mood and Affect: Mood normal.        Thought Content: Thought content normal.      UC Treatments / Results  Labs (all labs ordered are listed, but only abnormal results are displayed) Labs Reviewed - No data to display  EKG   Radiology DG Chest 2 View Result Date: 06/19/2023 CLINICAL DATA:  Low-grade fever, cough, and hemoptysis for the past week. EXAM: CHEST - 2 VIEW COMPARISON:  Chest x-ray dated October 21, 2014. FINDINGS: The heart size and mediastinal contours are within normal limits. Both lungs are clear. The visualized skeletal structures are unremarkable. IMPRESSION: No active cardiopulmonary disease. Electronically Signed   By: Obie Dredge M.D.   On: 06/19/2023 09:58    Procedures Procedures (including critical care time)  Medications Ordered in UC Medications - No data to display  Initial Impression / Assessment and Plan / UC Course  I have reviewed the triage vital signs and the nursing notes.  Pertinent labs & imaging results that were available during my care of the patient  were reviewed by me and considered in my medical decision making (see chart for details).  X-ray without acute findings.  Will treat to cover developing bronchitis and recommended follow-up if no gradual improvement or with any further concerns.  Final Clinical Impressions(s) / UC Diagnoses   Final diagnoses:  Acute bronchitis, unspecified organism   Discharge Instructions   None    ED Prescriptions     Medication Sig Dispense Auth. Provider   predniSONE (DELTASONE) 20 MG tablet Take 2 tablets (40 mg total) by mouth daily with breakfast for 5 days. 10 tablet Tomi Bamberger, PA-C      PDMP not reviewed this encounter.   Tomi Bamberger, PA-C 06/19/23 1019

## 2023-06-19 NOTE — ED Triage Notes (Signed)
"  I am still coughing and I have noticed blood in it at times (streaks) and low grade Fever at night, with ? Sob". "This has been about 7 days now". "We did testing at last visit and all Negative".

## 2023-06-20 ENCOUNTER — Encounter: Payer: Self-pay | Admitting: Physician Assistant

## 2023-07-08 ENCOUNTER — Ambulatory Visit
Admission: EM | Admit: 2023-07-08 | Discharge: 2023-07-08 | Disposition: A | Discharge status: Home / Self Care | Payer: 59 | Attending: Physician Assistant | Admitting: Physician Assistant

## 2023-07-08 DIAGNOSIS — J209 Acute bronchitis, unspecified: Secondary | ICD-10-CM

## 2023-07-08 MED ORDER — BENZONATATE 100 MG PO CAPS: 100.0000 mg | ORAL_CAPSULE | Freq: Three times a day (TID) | ORAL | 0 refills | Status: AC

## 2023-07-08 MED ORDER — AZITHROMYCIN 250 MG PO TABS: 250.0000 mg | ORAL_TABLET | Freq: Every day | ORAL | 0 refills | Status: AC

## 2023-07-08 NOTE — ED Triage Notes (Signed)
"  I have had this cough for over 3 wks now that just won't go away". No fever. No sob. No wheezing.

## 2023-07-08 NOTE — ED Provider Notes (Signed)
 [facility]    CSN: [id number] Arrival date & time: 07/08/23  9187      History   Chief Complaint Chief Complaint  Patient presents with   Cough    HPI Gary Dorsey is a 33 y.o. male.   Patient here today for evaluation of cough that he has had over 3 weeks.  He reports that cough will not go away.  He states that he did have x-ray a few weeks ago that was negative.  He has not had any shortness of breath or wheezing.  He denies any fever.  The history is provided by the patient.  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 Authorizing Provider  Acetaminophen -guaiFENesin (MUCINEX COLD & FLU PO) Take by mouth.   Yes [provider]  azithromycin  (ZITHROMAX ) 250 MG tablet Take 1 tablet (250 mg total) by mouth daily. Take first 2 tablets together, then 1 every day until finished. 07/08/23  Yes Billy Asberry FALCON, PA-C  benzonatate  (TESSALON ) 100 MG capsule Take 1 capsule (100 mg total) by mouth every 8 (eight) hours. 07/08/23  Yes Billy Asberry FALCON, PA-C  cyclobenzaprine (FLEXERIL) 5 MG tablet Take 5 mg by mouth at bedtime. 10/02/12    [provider]  Pseudoephedrine-APAP-DM (DAYQUIL PO) Take by mouth.    [provider]    Family History Family History  Problem Relation Age of Onset   Arthritis Mother    Stroke Father    Diabetes Father    Heart attack Father    High blood pressure Father    Suicidality Sister    Colon cancer Other        great uncle   Migraines Neg Hx     Social History Social History   Tobacco Use   Smoking status: Never   Smokeless tobacco: Never  Vaping Use   Vaping status: Never Used  Substance Use Topics   Alcohol  use: Yes    Alcohol /week: 2.0 standard drinks of alcohol     Types: 2 Cans of beer per week    Comment: occassionally.   Drug use: Never     Allergies   Iodinated contrast media   Review of Systems Review of Systems  Constitutional:  Negative for chills and fever.  HENT:  Negative for congestion, ear pain and sore throat.   Eyes:  Negative for discharge and redness.  Respiratory:  Positive for cough. Negative for shortness of breath and wheezing.   Gastrointestinal:  Negative for abdominal pain, nausea and vomiting.  Physical Exam Triage Vital Signs ED Triage Vitals  Encounter Vitals Group     BP 07/08/23 0934 118/81     Systolic BP Percentile --      Diastolic BP Percentile --      Pulse Rate 07/08/23 0934 77     Resp 07/08/23 0934 20     Temp 07/08/23 0934 98.6 F (37 C)     Temp Source 07/08/23 0934 Oral     SpO2 07/08/23 0934 98 %     Weight 07/08/23 0932 199 lb 15.3 oz (90.7 kg)     Height 07/08/23 0932 5' 8 (1.727 m)     Head Circumference --      Peak Flow --      Pain Score 07/08/23 0932 0     Pain Loc --      Pain Education --      Exclude from Growth Chart --    No data found.  Updated Vital Signs BP 118/81 (BP Location: Left Arm)   Pulse 77   Temp 98.6 F (37 C) (Oral)   Resp 20   Ht 5' 8 (1.727 m)   Wt 199 lb 15.3 oz (90.7 kg)   SpO2 98%   BMI 30.40 kg/m   Visual Acuity Right Eye Distance:    Left Eye Distance:   Bilateral Distance:    Right Eye Near:   Left Eye Near:    Bilateral Near:     Physical Exam Vitals and nursing note reviewed.  Constitutional:      General: He is not in acute distress.    Appearance: Normal appearance. He is not ill-appearing.  HENT:     Head: Normocephalic and atraumatic.     Nose: Nose normal. No congestion.     Mouth/Throat:     Mouth: Mucous membranes are moist.     Pharynx: Oropharynx is clear. No oropharyngeal exudate or posterior oropharyngeal erythema.  Eyes:     Conjunctiva/sclera: Conjunctivae normal.  Cardiovascular:     Rate and Rhythm: Normal rate and regular rhythm.     Heart sounds: Normal heart sounds. No murmur heard. Pulmonary:     Effort: Pulmonary effort is normal. No respiratory distress.     Breath sounds: Normal breath sounds. No wheezing, rhonchi or rales.     Comments: Deep breathing produces cough Skin:    General: Skin is warm and dry.  Neurological:     Mental Status: He is alert.  Psychiatric:        Mood and Affect: Mood normal.        Thought Content: Thought content normal.      UC Treatments / Results  Labs (all labs ordered are listed, but only abnormal results are displayed) Labs Reviewed - No data to display  EKG   Radiology No results found.  Procedures Procedures (including critical care time)  Medications Ordered in UC Medications - No data to display  Initial Impression / Assessment and Plan / UC Course  I have reviewed the triage vital signs and the nursing notes.  Pertinent labs & imaging results that were available during my care of the patient were reviewed by me and considered in my medical decision making (see chart for details).    Suspect continued bronchitis symptoms however given persistence will treat to cover possible atypical pneumonia with Z-Pak and Tessalon  Perles for cough relief.  Encouraged follow-up if no gradual improvement with any further  concerns.  Final Clinical Impressions(s) / UC Diagnoses  Final diagnoses:  Acute bronchitis, unspecified organism   Discharge Instructions   None    ED Prescriptions     Medication Sig Dispense Auth. Provider   azithromycin  (ZITHROMAX ) 250 MG tablet Take 1 tablet (250 mg total) by mouth daily. Take first 2 tablets together, then 1 every day until finished. 6 tablet Billy Stabs F, PA-C   benzonatate  (TESSALON ) 100 MG capsule Take 1 capsule (100 mg total) by mouth every 8 (eight) hours. 21 capsule Billy Stabs FALCON, PA-C      PDMP not reviewed this encounter.   Billy Stabs FALCON, PA-C 07/08/23 1053
